# Patient Record
Sex: Male | Born: 1935 | Race: White | Hispanic: No | Marital: Married | Smoking: Never smoker
Health system: Southern US, Community
[De-identification: ages and names within clinical notes are randomized; demographics above are authoritative.]

## PROBLEM LIST (undated history)

## (undated) DIAGNOSIS — Z950 Presence of cardiac pacemaker: Secondary | ICD-10-CM

## (undated) DIAGNOSIS — I2510 Atherosclerotic heart disease of native coronary artery without angina pectoris: Secondary | ICD-10-CM

## (undated) DIAGNOSIS — R001 Bradycardia, unspecified: Secondary | ICD-10-CM

## (undated) DIAGNOSIS — I10 Essential (primary) hypertension: Secondary | ICD-10-CM

## (undated) DIAGNOSIS — N189 Chronic kidney disease, unspecified: Secondary | ICD-10-CM

## (undated) DIAGNOSIS — E119 Type 2 diabetes mellitus without complications: Secondary | ICD-10-CM

## (undated) HISTORY — PX: PACEMAKER INSERTION: SHX728

## (undated) HISTORY — PX: REPLACEMENT TOTAL KNEE BILATERAL: SUR1225

## (undated) HISTORY — PX: SHOULDER SURGERY: SHX246

---

## 2016-07-26 ENCOUNTER — Observation Stay (HOSPITAL_BASED_OUTPATIENT_CLINIC_OR_DEPARTMENT_OTHER)
Admit: 2016-07-26 | Discharge: 2016-07-26 | Disposition: A | Discharge status: Home / Self Care | Payer: Medicare HMO | Attending: Internal Medicine | Admitting: Internal Medicine

## 2016-07-26 ENCOUNTER — Encounter: Payer: Self-pay | Admitting: Emergency Medicine

## 2016-07-26 ENCOUNTER — Emergency Department: Payer: Medicare HMO

## 2016-07-26 ENCOUNTER — Observation Stay
Admission: EM | Admit: 2016-07-26 | Discharge: 2016-07-28 | Disposition: A | Discharge status: Home / Self Care | Payer: Medicare HMO | Attending: Internal Medicine | Admitting: Internal Medicine

## 2016-07-26 DIAGNOSIS — D649 Anemia, unspecified: Secondary | ICD-10-CM | POA: Insufficient documentation

## 2016-07-26 DIAGNOSIS — N183 Chronic kidney disease, stage 3 (moderate): Secondary | ICD-10-CM | POA: Diagnosis not present

## 2016-07-26 DIAGNOSIS — I2510 Atherosclerotic heart disease of native coronary artery without angina pectoris: Secondary | ICD-10-CM | POA: Insufficient documentation

## 2016-07-26 DIAGNOSIS — M1711 Unilateral primary osteoarthritis, right knee: Secondary | ICD-10-CM | POA: Insufficient documentation

## 2016-07-26 DIAGNOSIS — R7989 Other specified abnormal findings of blood chemistry: Secondary | ICD-10-CM | POA: Insufficient documentation

## 2016-07-26 DIAGNOSIS — Z96653 Presence of artificial knee joint, bilateral: Secondary | ICD-10-CM | POA: Insufficient documentation

## 2016-07-26 DIAGNOSIS — E1122 Type 2 diabetes mellitus with diabetic chronic kidney disease: Secondary | ICD-10-CM | POA: Insufficient documentation

## 2016-07-26 DIAGNOSIS — R001 Bradycardia, unspecified: Secondary | ICD-10-CM | POA: Diagnosis not present

## 2016-07-26 DIAGNOSIS — R778 Other specified abnormalities of plasma proteins: Secondary | ICD-10-CM

## 2016-07-26 DIAGNOSIS — G4733 Obstructive sleep apnea (adult) (pediatric): Secondary | ICD-10-CM | POA: Insufficient documentation

## 2016-07-26 DIAGNOSIS — R55 Syncope and collapse: Principal | ICD-10-CM | POA: Insufficient documentation

## 2016-07-26 DIAGNOSIS — Z6834 Body mass index (BMI) 34.0-34.9, adult: Secondary | ICD-10-CM | POA: Insufficient documentation

## 2016-07-26 DIAGNOSIS — Z7901 Long term (current) use of anticoagulants: Secondary | ICD-10-CM | POA: Insufficient documentation

## 2016-07-26 DIAGNOSIS — F329 Major depressive disorder, single episode, unspecified: Secondary | ICD-10-CM | POA: Diagnosis not present

## 2016-07-26 DIAGNOSIS — M25461 Effusion, right knee: Secondary | ICD-10-CM | POA: Insufficient documentation

## 2016-07-26 DIAGNOSIS — R262 Difficulty in walking, not elsewhere classified: Secondary | ICD-10-CM

## 2016-07-26 DIAGNOSIS — M25511 Pain in right shoulder: Secondary | ICD-10-CM | POA: Insufficient documentation

## 2016-07-26 DIAGNOSIS — I340 Nonrheumatic mitral (valve) insufficiency: Secondary | ICD-10-CM | POA: Insufficient documentation

## 2016-07-26 DIAGNOSIS — N179 Acute kidney failure, unspecified: Secondary | ICD-10-CM | POA: Diagnosis not present

## 2016-07-26 DIAGNOSIS — I6523 Occlusion and stenosis of bilateral carotid arteries: Secondary | ICD-10-CM | POA: Insufficient documentation

## 2016-07-26 DIAGNOSIS — H538 Other visual disturbances: Secondary | ICD-10-CM | POA: Insufficient documentation

## 2016-07-26 DIAGNOSIS — I700 Atherosclerosis of aorta: Secondary | ICD-10-CM | POA: Insufficient documentation

## 2016-07-26 DIAGNOSIS — M6281 Muscle weakness (generalized): Secondary | ICD-10-CM

## 2016-07-26 DIAGNOSIS — Z8546 Personal history of malignant neoplasm of prostate: Secondary | ICD-10-CM | POA: Insufficient documentation

## 2016-07-26 DIAGNOSIS — E039 Hypothyroidism, unspecified: Secondary | ICD-10-CM | POA: Insufficient documentation

## 2016-07-26 DIAGNOSIS — M549 Dorsalgia, unspecified: Secondary | ICD-10-CM

## 2016-07-26 DIAGNOSIS — E860 Dehydration: Secondary | ICD-10-CM | POA: Insufficient documentation

## 2016-07-26 DIAGNOSIS — Z823 Family history of stroke: Secondary | ICD-10-CM | POA: Insufficient documentation

## 2016-07-26 DIAGNOSIS — I129 Hypertensive chronic kidney disease with stage 1 through stage 4 chronic kidney disease, or unspecified chronic kidney disease: Secondary | ICD-10-CM | POA: Diagnosis not present

## 2016-07-26 DIAGNOSIS — I214 Non-ST elevation (NSTEMI) myocardial infarction: Secondary | ICD-10-CM

## 2016-07-26 DIAGNOSIS — I480 Paroxysmal atrial fibrillation: Secondary | ICD-10-CM | POA: Diagnosis not present

## 2016-07-26 DIAGNOSIS — K219 Gastro-esophageal reflux disease without esophagitis: Secondary | ICD-10-CM | POA: Insufficient documentation

## 2016-07-26 DIAGNOSIS — Z23 Encounter for immunization: Secondary | ICD-10-CM | POA: Insufficient documentation

## 2016-07-26 DIAGNOSIS — D696 Thrombocytopenia, unspecified: Secondary | ICD-10-CM | POA: Diagnosis not present

## 2016-07-26 DIAGNOSIS — Z794 Long term (current) use of insulin: Secondary | ICD-10-CM | POA: Insufficient documentation

## 2016-07-26 DIAGNOSIS — W19XXXA Unspecified fall, initial encounter: Secondary | ICD-10-CM

## 2016-07-26 DIAGNOSIS — E875 Hyperkalemia: Secondary | ICD-10-CM | POA: Diagnosis not present

## 2016-07-26 DIAGNOSIS — N189 Chronic kidney disease, unspecified: Secondary | ICD-10-CM

## 2016-07-26 DIAGNOSIS — M109 Gout, unspecified: Secondary | ICD-10-CM | POA: Insufficient documentation

## 2016-07-26 DIAGNOSIS — M25512 Pain in left shoulder: Secondary | ICD-10-CM | POA: Insufficient documentation

## 2016-07-26 DIAGNOSIS — E669 Obesity, unspecified: Secondary | ICD-10-CM | POA: Insufficient documentation

## 2016-07-26 DIAGNOSIS — Z950 Presence of cardiac pacemaker: Secondary | ICD-10-CM | POA: Insufficient documentation

## 2016-07-26 HISTORY — DX: Essential (primary) hypertension: I10

## 2016-07-26 HISTORY — DX: Bradycardia, unspecified: R00.1

## 2016-07-26 HISTORY — DX: Chronic kidney disease, unspecified: N18.9

## 2016-07-26 HISTORY — DX: Type 2 diabetes mellitus without complications: E11.9

## 2016-07-26 HISTORY — DX: Atherosclerotic heart disease of native coronary artery without angina pectoris: I25.10

## 2016-07-26 HISTORY — DX: Presence of cardiac pacemaker: Z95.0

## 2016-07-26 LAB — CBC WITH DIFFERENTIAL/PLATELET
BASOS ABS: 0 10*3/uL (ref 0–0.1)
BASOS PCT: 1 %
Eosinophils Absolute: 0.1 10*3/uL (ref 0–0.7)
Eosinophils Relative: 1 %
HEMATOCRIT: 29.8 % — AB (ref 40.0–52.0)
HEMOGLOBIN: 10.1 g/dL — AB (ref 13.0–18.0)
LYMPHS PCT: 11 %
Lymphs Abs: 0.9 10*3/uL — ABNORMAL LOW (ref 1.0–3.6)
MCH: 32.5 pg (ref 26.0–34.0)
MCHC: 33.9 g/dL (ref 32.0–36.0)
MCV: 95.6 fL (ref 80.0–100.0)
Monocytes Absolute: 0.4 10*3/uL (ref 0.2–1.0)
Monocytes Relative: 5 %
NEUTROS ABS: 6.6 10*3/uL — AB (ref 1.4–6.5)
NEUTROS PCT: 82 %
Platelets: 134 10*3/uL — ABNORMAL LOW (ref 150–440)
RBC: 3.12 MIL/uL — AB (ref 4.40–5.90)
RDW: 15.5 % — AB (ref 11.5–14.5)
WBC: 8.1 10*3/uL (ref 3.8–10.6)

## 2016-07-26 LAB — COMPREHENSIVE METABOLIC PANEL
ALBUMIN: 3.8 g/dL (ref 3.5–5.0)
ALK PHOS: 41 U/L (ref 38–126)
ALT: 9 U/L — ABNORMAL LOW (ref 17–63)
ANION GAP: 6 (ref 5–15)
AST: 12 U/L — AB (ref 15–41)
BILIRUBIN TOTAL: 0.6 mg/dL (ref 0.3–1.2)
BUN: 44 mg/dL — AB (ref 6–20)
CALCIUM: 8.4 mg/dL — AB (ref 8.9–10.3)
CO2: 23 mmol/L (ref 22–32)
Chloride: 110 mmol/L (ref 101–111)
Creatinine, Ser: 2.76 mg/dL — ABNORMAL HIGH (ref 0.61–1.24)
GFR calc Af Amer: 23 mL/min — ABNORMAL LOW (ref >60)
GFR calc non Af Amer: 20 mL/min — ABNORMAL LOW (ref >60)
GLUCOSE: 121 mg/dL — AB (ref 65–99)
Potassium: 5.2 mmol/L — ABNORMAL HIGH (ref 3.5–5.1)
SODIUM: 139 mmol/L (ref 135–145)
TOTAL PROTEIN: 6.8 g/dL (ref 6.5–8.1)

## 2016-07-26 LAB — TROPONIN I
TROPONIN I: 0.1 ng/mL — AB (ref <0.03)
Troponin I: 0.09 ng/mL (ref <0.03)

## 2016-07-26 LAB — GLUCOSE, CAPILLARY
GLUCOSE-CAPILLARY: 125 mg/dL — AB (ref 65–99)
Glucose-Capillary: 130 mg/dL — ABNORMAL HIGH (ref 65–99)

## 2016-07-26 MED ORDER — GABAPENTIN 300 MG PO CAPS
300.0000 mg | ORAL_CAPSULE | Freq: Every day | ORAL | Status: DC
  Administered 2016-07-26 – 2016-07-27 (×2): 300 mg via ORAL
  Filled 2016-07-26 (×2): qty 1

## 2016-07-26 MED ORDER — INSULIN ASPART 100 UNIT/ML ~~LOC~~ SOLN: 0.0000 [IU] | Freq: Every day | SUBCUTANEOUS | Status: DC

## 2016-07-26 MED ORDER — INSULIN ASPART 100 UNIT/ML ~~LOC~~ SOLN
0.0000 [IU] | Freq: Three times a day (TID) | SUBCUTANEOUS | Status: DC
  Administered 2016-07-28: 1 [IU] via SUBCUTANEOUS
  Filled 2016-07-26 (×2): qty 1

## 2016-07-26 MED ORDER — LEVOTHYROXINE SODIUM 50 MCG PO TABS
25.0000 ug | ORAL_TABLET | Freq: Every day | ORAL | Status: DC
  Administered 2016-07-27 – 2016-07-28 (×2): 25 ug via ORAL
  Filled 2016-07-26 (×2): qty 1

## 2016-07-26 MED ORDER — ALLOPURINOL 100 MG PO TABS
100.0000 mg | ORAL_TABLET | Freq: Every day | ORAL | Status: DC
  Administered 2016-07-26 – 2016-07-28 (×3): 100 mg via ORAL
  Filled 2016-07-26 (×3): qty 1

## 2016-07-26 MED ORDER — ACETAMINOPHEN 650 MG RE SUPP: 650.0000 mg | Freq: Four times a day (QID) | RECTAL | Status: DC | PRN

## 2016-07-26 MED ORDER — ENOXAPARIN SODIUM 30 MG/0.3ML ~~LOC~~ SOLN
30.0000 mg | SUBCUTANEOUS | Status: DC
  Administered 2016-07-26: 30 mg via SUBCUTANEOUS
  Filled 2016-07-26: qty 0.3

## 2016-07-26 MED ORDER — PNEUMOCOCCAL VAC POLYVALENT 25 MCG/0.5ML IJ INJ
0.5000 mL | INJECTION | INTRAMUSCULAR | Status: DC
  Filled 2016-07-26: qty 0.5

## 2016-07-26 MED ORDER — GLIPIZIDE 10 MG PO TABS
10.0000 mg | ORAL_TABLET | Freq: Every day | ORAL | Status: DC
  Administered 2016-07-27 – 2016-07-28 (×2): 10 mg via ORAL
  Filled 2016-07-26 (×2): qty 1

## 2016-07-26 MED ORDER — SODIUM CHLORIDE 0.9 % IV BOLUS (SEPSIS)
500.0000 mL | Freq: Once | INTRAVENOUS | Status: AC
  Administered 2016-07-26: 500 mL via INTRAVENOUS

## 2016-07-26 MED ORDER — ACETAMINOPHEN 325 MG PO TABS
650.0000 mg | ORAL_TABLET | Freq: Four times a day (QID) | ORAL | Status: DC | PRN
  Administered 2016-07-27: 650 mg via ORAL
  Filled 2016-07-26: qty 2

## 2016-07-26 MED ORDER — ASPIRIN 81 MG PO CHEW
324.0000 mg | CHEWABLE_TABLET | Freq: Once | ORAL | Status: AC
  Administered 2016-07-26: 324 mg via ORAL
  Filled 2016-07-26: qty 4

## 2016-07-26 MED ORDER — PANTOPRAZOLE SODIUM 40 MG PO TBEC
40.0000 mg | DELAYED_RELEASE_TABLET | Freq: Every day | ORAL | Status: DC
  Administered 2016-07-27 – 2016-07-28 (×2): 40 mg via ORAL
  Filled 2016-07-26 (×2): qty 1

## 2016-07-26 MED ORDER — CITALOPRAM HYDROBROMIDE 20 MG PO TABS
40.0000 mg | ORAL_TABLET | Freq: Every day | ORAL | Status: DC
  Administered 2016-07-27 – 2016-07-28 (×2): 40 mg via ORAL
  Filled 2016-07-26 (×2): qty 2

## 2016-07-26 MED ORDER — SODIUM CHLORIDE 0.9 % IV SOLN
INTRAVENOUS | Status: DC
  Administered 2016-07-26 – 2016-07-28 (×3): via INTRAVENOUS

## 2016-07-26 MED ORDER — INFLUENZA VAC SPLIT QUAD 0.5 ML IM SUSY
0.5000 mL | PREFILLED_SYRINGE | INTRAMUSCULAR | Status: AC
  Administered 2016-07-27: 0.5 mL via INTRAMUSCULAR
  Filled 2016-07-26: qty 0.5

## 2016-07-26 MED ORDER — FERROUS SULFATE 325 (65 FE) MG PO TABS
325.0000 mg | ORAL_TABLET | Freq: Every day | ORAL | Status: DC
  Administered 2016-07-27 – 2016-07-28 (×2): 325 mg via ORAL
  Filled 2016-07-26 (×2): qty 1

## 2016-07-26 NOTE — ED Triage Notes (Signed)
Pt reports he was driving and started having bilateral shoulder blade pain; reports he felt like he was going to pass out. Pt arrived via EMS alert and oriented, denies any pain at present.

## 2016-07-26 NOTE — ED Provider Notes (Addendum)
[facility] Emergency Department Provider Note  ____________________________________________  Time seen: Approximately 3:30 PM  I have reviewed the triage vital signs and the nursing notes.   HISTORY  Chief Complaint Dizziness   HPI Patrick Robinson is a 82 y.o. male with a history of sick sinus syndrome status post pacemaker, CAD s/p PCI and DES 2003, diabetes, prostate cancer now in remission, OSA, CKD, hypertension who presents for evaluation of syncope. Patient reports that he was driving home with his wife next to him when he developed sudden onset of severe pain located between his shoulder blades. He reports that the pain was: Nonradiating. He felt dizzy, nauseous, short of breath. He was able to pull over and when he got out of the car patient had a syncopal episode. Bystanders or neck that him and were able to hold patient and he did not sustain any injuries. He denies ever having anything similar. Patient at this time denies any pain. He reports that he still feels a little dizzy but the shortness of breath and nausea have resolved. Patient denies any personal or family history of blood clots, recent travel or immobilization, leg pain or swelling, hemoptysis.  Past Medical History:  Diagnosis Date  . Chronic kidney disease   . Coronary artery disease   . Diabetes mellitus without complication (HCC)   . Hypertension   . Presence of permanent cardiac pacemaker     Patient Active Problem List   Diagnosis Date Noted  . Syncope 07/26/2016    Past Surgical History:  Procedure Laterality Date  . PACEMAKER INSERTION    . REPLACEMENT TOTAL KNEE BILATERAL    . SHOULDER SURGERY Bilateral     Prior to Admission medications   Medication Sig Start Date End Date Taking? Authorizing Provider  allopurinol (ZYLOPRIM) 100 MG tablet Take 100 mg by mouth daily.   Yes Historical Provider, MD  citalopram (CELEXA) 40 MG tablet Take 40 mg by mouth daily.   Yes  Historical Provider, MD  ferrous sulfate 325 (65 FE) MG tablet Take 325 mg by mouth daily with breakfast.   Yes Historical Provider, MD  gabapentin (NEURONTIN) 300 MG capsule Take 300 mg by mouth at bedtime.   Yes Historical Provider, MD  glipiZIDE (GLUCOTROL) 10 MG tablet Take 10 mg by mouth daily before breakfast.   Yes Historical Provider, MD  hydrochlorothiazide (HYDRODIURIL) 25 MG tablet Take 25 mg by mouth daily.   Yes Historical Provider, MD  levothyroxine (SYNTHROID, LEVOTHROID) 25 MCG tablet Take 25 mcg by mouth daily before breakfast.   Yes Historical Provider, MD  lisinopril (PRINIVIL,ZESTRIL) 20 MG tablet Take 20 mg by mouth daily.   Yes Historical Provider, MD  losartan (COZAAR) 25 MG tablet Take 25 mg by mouth daily.   Yes Historical Provider, MD  omeprazole (PRILOSEC) 20 MG capsule Take 20 mg by mouth daily.   Yes Historical Provider, MD  oxybutynin (DITROPAN) 5 MG tablet Take 5 mg by mouth daily.   Yes Historical Provider, MD    Allergies Review of patient's allergies indicates no known allergies.  Family History  Problem Relation Age of Onset  . Alzheimer's disease Mother   . CVA Father     Social History Social History  Substance Use Topics  . Smoking status: Never Smoker  . Smokeless tobacco: Never Used  . Alcohol use No    Review of Systems  Constitutional: Negative for fever. + syncope Eyes: Negative for visual changes. ENT: Negative for sore throat.  Cardiovascular: Negative for chest pain. Respiratory: + shortness of breath. Gastrointestinal: Negative for abdominal pain, vomiting or diarrhea. + nausea Genitourinary: Negative for dysuria. Musculoskeletal: + upper back pain. Skin: Negative for rash. Neurological: Negative for headaches, weakness or numbness.  ____________________________________________   PHYSICAL EXAM:  VITAL SIGNS: ED Triage Vitals  Enc Vitals Group     BP 07/26/16 1516 100/64     Pulse Rate 07/26/16 1516 65     Resp 07/26/16  1516 16     Temp 07/26/16 1516 97.4 F (36.3 C)     Temp Source 07/26/16 1516 Oral     SpO2 07/26/16 1516 98 %     Weight 07/26/16 1517 251 lb (113.9 kg)     Height 07/26/16 1517 6' (1.829 m)     Head Circumference --      Peak Flow --      Pain Score 07/26/16 1517 0     Pain Loc --      Pain Edu? --      Excl. in GC? --     Constitutional: Alert and oriented. Well appearing and in no apparent distress. HEENT:      Head: Normocephalic and atraumatic.         Eyes: Conjunctivae are normal. Sclera is non-icteric. EOMI. PERRL      Mouth/Throat: Mucous membranes are moist.       Neck: Supple with no signs of meningismus. Cardiovascular: Regular rate and rhythm. No murmurs, gallops, or rubs. 2+ symmetrical distal pulses are present in all extremities. No JVD. sBP in the 80s on the R and 90s on the LUE Respiratory: Normal respiratory effort. Lungs are clear to auscultation bilaterally. No wheezes, crackles, or rhonchi.  Gastrointestinal: Soft, non tender, and non distended with positive bowel sounds. No rebound or guarding. Musculoskeletal: Nontender with normal range of motion in all extremities. No edema, cyanosis, or erythema of extremities. Neurologic: Normal speech and language. Face is symmetric. Moving all extremities. No gross focal neurologic deficits are appreciated. Skin: Skin is warm, dry and intact. No rash noted. Psychiatric: Mood and affect are normal. Speech and behavior are normal.  ____________________________________________   LABS (all labs ordered are listed, but only abnormal results are displayed)  Labs Reviewed  CBC WITH DIFFERENTIAL/PLATELET - Abnormal; Notable for the following:       Result Value   RBC 3.12 (*)    Hemoglobin 10.1 (*)    HCT 29.8 (*)    RDW 15.5 (*)    Platelets 134 (*)    Neutro Abs 6.6 (*)    Lymphs Abs 0.9 (*)    All other components within normal limits  TROPONIN I - Abnormal; Notable for the following:    Troponin I 0.09 (*)     All other components within normal limits  COMPREHENSIVE METABOLIC PANEL - Abnormal; Notable for the following:    Potassium 5.2 (*)    Glucose, Bld 121 (*)    BUN 44 (*)    Creatinine, Ser 2.76 (*)    Calcium 8.4 (*)    AST 12 (*)    ALT 9 (*)    GFR calc non Af Amer 20 (*)    GFR calc Af Amer 23 (*)    All other components within normal limits  GLUCOSE, CAPILLARY - Abnormal; Notable for the following:    Glucose-Capillary 130 (*)    All other components within normal limits  BASIC METABOLIC PANEL  CBC  CBC  CREATININE, SERUM  LIPID  PANEL  TROPONIN I  TROPONIN I  CBG MONITORING, ED   ____________________________________________  EKG  ED ECG REPORT I, Nita Sicklearolina Tienna Bienkowski, the attending physician, personally viewed and interpreted this ECG.  Paced rhythm, rate of 127, prolonged QTC, left bundle branch block, no ST elevations or depressions. No prior for comparison. ____________________________________________  RADIOLOGY  CXR:  Slight left base scarring. No edema or consolidation. Heart borderline enlarged. Pacemaker leads attached to right atrium and right ventricle. There is aortic atherosclerosis. ____________________________________________   PROCEDURES  Procedure(s) performed: None Procedures Critical Care performed:  Yes  CRITICAL CARE Performed by: Nita Sicklearolina Adana Marik  ?  Total critical care time: 35 min  Critical care time was exclusive of separately billable procedures and treating other patients.  Critical care was necessary to treat or prevent imminent or life-threatening deterioration.  Critical care was time spent personally by me on the following activities: development of treatment plan with patient and/or surrogate as well as nursing, discussions with consultants, evaluation of patient's response to treatment, examination of patient, obtaining history from patient or surrogate, ordering and performing treatments and interventions, ordering and  review of laboratory studies, ordering and review of radiographic studies, pulse oximetry and re-evaluation of patient's condition.  ____________________________________________   INITIAL IMPRESSION / ASSESSMENT AND PLAN / ED COURSE  82 y.o. male with a history of sick sinus syndrome status post pacemaker, CAD s/p PCI and DES 2003, diabetes, prostate cancer now in remission, OSA, CKD, hypertension who presents for evaluation of one episode of syncope in the setting of sudden severe upper back pain associated with shortness of breath and nausea. First EKG showing paced rhythm with no ST elevations. No prior for comparison. Patient currently chest pain-free. His heart rate is in the 60s during my evaluation with systolics in the 80s in the RUE and 90s in the LUE. Neuro intact. Ddx ACS vs dissection. Plan for IVF and pCXR and will send patient to CT. Labs including troponin and admission.  Clinical Course  Comment By Time  I also spoke with [employer] who will send a representative in the morning to interrogate patient's pacemaker.  Nita Sicklearolina Mone Commisso, MD 10/18 1802   _________________________ 5:00 PM on 07/26/2016 ----------------------------------------- Troponin 0.09. AKI with creatinine 2.76 (baseline 2.0) therefore unable to obtain CTA dissection. Since patient is pain free and with symmetric BP in b/l arms and no mediastinum widening on CXR will hold off CT. Recommended patient obtain ECHO in house. [facility]. BP improved with 500cc bolus.   Pertinent labs & imaging results that were available during my care of the patient were reviewed by me and considered in my medical decision making (see chart for details).    ____________________________________________   FINAL CLINICAL IMPRESSION(S) / ED DIAGNOSES  Final diagnoses:  NSTEMI (non-ST elevated myocardial infarction) (HCC)  Syncope, unspecified syncope type      NEW MEDICATIONS STARTED DURING THIS VISIT:  New  Prescriptions   No medications on file     Note:  This document was prepared using Dragon voice recognition software and may include unintentional dictation errors.     Nita Sicklearolina Lucelia Lacey, MD 07/26/16 1746    Nita Sicklearolina Gurneet Matarese, MD 07/26/16 [phone]

## 2016-07-26 NOTE — Progress Notes (Signed)
Anticoagulation monitoring(Lovenox):  82 yo  male ordered Lovenox 40 mg Q24h  Filed Weights   07/26/16 1517  Weight: 251 lb (113.9 kg)   BMI    Lab Results  Component Value Date   CREATININE 2.76 (H) 07/26/2016   Estimated Creatinine Clearance: 27.8 mL/min (by C-G formula based on SCr of 2.76 mg/dL (H)). Hemoglobin & Hematocrit     Component Value Date/Time   HGB 10.1 (L) 07/26/2016 1523   HCT 29.8 (L) 07/26/2016 1523     Per Protocol for Patient with estCrcl < 30 ml/min and BMI < 40, will transition to Lovenox 30 mg Q24h.

## 2016-07-26 NOTE — H&P (Signed)
[facility] -  at [facility]   PATIENT NAME: Patrick Robinson    MR#:  [id number]  DATE OF BIRTH:  [birth year]  DATE OF ADMISSION:  07/26/2016  PRIMARY CARE PHYSICIAN: Ventura Sellers   REQUESTING/REFERRING PHYSICIAN: Dr Cecil Cobbs  CHIEF COMPLAINT:   Chief Complaint  Patient presents with  . Dizziness    HISTORY OF PRESENT ILLNESS:  Patrick Robinson  is a 82 y.o. male was driving today when he developed some hurting in his shoulder blades really bad. He couldn't see very well so he pulled over his car. He told his wife that she is going to have to drive. He got out of the car and then passed out for a few seconds and then they called EMS. Patient currently feels fine and has no pain in his back. His vision is better. In the ER, his blood pressure was very low and he was given a fluid bolus. He currently feels okay. No complaints of chest pain. He does have some shortness of breath. He does have some fatigue. Patient did state that he did not urinate in the middle night last night and did not urinate until 1 PM today.  PAST MEDICAL HISTORY:   Past Medical History:  Diagnosis Date  . Chronic kidney disease   . Coronary artery disease   . Diabetes mellitus without complication (HCC)   . Hypertension   . Presence of permanent cardiac pacemaker     PAST SURGICAL HISTORY:   Past Surgical History:  Procedure Laterality Date  . PACEMAKER INSERTION    . REPLACEMENT TOTAL KNEE BILATERAL    . SHOULDER SURGERY Bilateral     SOCIAL HISTORY:   Social History  Substance Use Topics  . Smoking status: Never Smoker  . Smokeless tobacco: Never Used  . Alcohol use No    FAMILY HISTORY:   Family History  Problem Relation Age of Onset  . Alzheimer's disease Mother   . CVA Father     DRUG ALLERGIES:  No Known Allergies  REVIEW OF SYSTEMS:  CONSTITUTIONAL: No fever, Positive for fatigue. Positive for weight loss EYES: No blurred or double vision.  Couldn't see during the episode. He does wear glasses. Occasionally has blurred vision. EARS, NOSE, AND THROAT: No tinnitus or ear pain. No sore throat. Decreased hearing RESPIRATORY: Positive for cough, some shortness of breath. No wheezing or hemoptysis.  CARDIOVASCULAR: No chest pain, orthopnea, edema.  GASTROINTESTINAL: No nausea, vomiting, or abdominal pain. No blood in bowel movements. Had more frequent bowel movements yesterday GENITOURINARY: No dysuria, hematuria.  ENDOCRINE: No polyuria, nocturia,  HEMATOLOGY: No anemia, easy bruising or bleeding SKIN: No rash or lesion. MUSCULOSKELETAL: No joint pain or arthritis.   NEUROLOGIC: No tingling, numbness, weakness.  PSYCHIATRY: No anxiety or depression.   MEDICATIONS AT HOME:   Prior to Admission medications   Medication Sig Start Date End Date Taking? Authorizing Provider  allopurinol (ZYLOPRIM) 100 MG tablet Take 100 mg by mouth daily.   Yes Historical Provider, MD  citalopram (CELEXA) 40 MG tablet Take 40 mg by mouth daily.   Yes Historical Provider, MD  ferrous sulfate 325 (65 FE) MG tablet Take 325 mg by mouth daily with breakfast.   Yes Historical Provider, MD  gabapentin (NEURONTIN) 300 MG capsule Take 300 mg by mouth at bedtime.   Yes Historical Provider, MD  glipiZIDE (GLUCOTROL) 10 MG tablet Take 10 mg by mouth daily before breakfast.   Yes Historical Provider, MD  hydrochlorothiazide (HYDRODIURIL) 25  MG tablet Take 25 mg by mouth daily.   Yes Historical Provider, MD  levothyroxine (SYNTHROID, LEVOTHROID) 25 MCG tablet Take 25 mcg by mouth daily before breakfast.   Yes Historical Provider, MD  lisinopril (PRINIVIL,ZESTRIL) 20 MG tablet Take 20 mg by mouth daily.   Yes Historical Provider, MD  losartan (COZAAR) 25 MG tablet Take 25 mg by mouth daily.   Yes Historical Provider, MD  omeprazole (PRILOSEC) 20 MG capsule Take 20 mg by mouth daily.   Yes Historical Provider, MD  oxybutynin (DITROPAN) 5 MG tablet Take 5 mg by mouth  daily.   Yes Historical Provider, MD      VITAL SIGNS:  Blood pressure (!) 143/59, pulse 61, temperature 98 F (36.7 C), temperature source Oral, resp. rate 18, height 6' (1.829 m), weight 113.9 kg (251 lb), SpO2 100 %.  PHYSICAL EXAMINATION:  GENERAL:  82 y.o.-year-old patient lying in the bed with no acute distress.  EYES: Pupils equal, round, reactive to light and accommodation. No scleral icterus. Extraocular muscles intact.  HEENT: Head atraumatic, normocephalic. Oropharynx and nasopharynx clear.  NECK:  Supple, no jugular venous distention. No thyroid enlargement, no tenderness.  LUNGS: Normal breath sounds bilaterally, no wheezing, rales,rhonchi or crepitation. No use of accessory muscles of respiration.  CARDIOVASCULAR: S1, S2 normal. No murmurs, rubs, or gallops.  ABDOMEN: Soft, nontender, nondistended. Bowel sounds present. No organomegaly or mass.  EXTREMITIES: Trace edema, no cyanosis, or clubbing.  NEUROLOGIC: Cranial nerves II through XII are intact. Muscle strength 5/5 in all extremities. Sensation intact. Gait not checked.  PSYCHIATRIC: The patient is alert and oriented x 3.  SKIN: No rash, lesion, or ulcer.   LABORATORY PANEL:   CBC  Recent Labs Lab 07/26/16 1523  WBC 8.1  HGB 10.1*  HCT 29.8*  PLT 134*   ------------------------------------------------------------------------------------------------------------------  Chemistries   Recent Labs Lab 07/26/16 1523  NA 139  K 5.2*  CL 110  CO2 23  GLUCOSE 121*  BUN 44*  CREATININE 2.76*  CALCIUM 8.4*  AST 12*  ALT 9*  ALKPHOS 41  BILITOT 0.6   ------------------------------------------------------------------------------------------------------------------  Cardiac Enzymes  Recent Labs Lab 07/26/16 1523  TROPONINI 0.09*   ------------------------------------------------------------------------------------------------------------------  RADIOLOGY:  Dg Chest Portable 1 View  Result Date:  07/26/2016 CLINICAL DATA:  Chest pain radiating into shoulders.  Hypertension. EXAM: PORTABLE CHEST 1 VIEW COMPARISON:  None. FINDINGS: There is no edema or consolidation. There is slight scarring in the left base. Heart is borderline enlarged with pulmonary vascularity normal. Pacemaker leads are attached to the right atrium and right ventricle. No adenopathy. There is atherosclerotic calcification in the aorta. There is evidence of total shoulder replacements bilaterally. IMPRESSION: Slight left base scarring. No edema or consolidation. Heart borderline enlarged. Pacemaker leads attached to right atrium and right ventricle. There is aortic atherosclerosis. Electronically Signed   By: Bretta Bang III M.D.   On: 07/26/2016 16:16    EKG:   Paced.  IMPRESSION AND PLAN:   1. Syncope. Likely secondary to hypotension. Give IV fluid hydration overnight hold antihypertensive medications. ER physician called to interrogate pacer. Obtain echocardiogram. 2. Acute kidney injury on chronic kidney disease stage III. Give IV fluid hydration and check creatinine in the morning. 3. Hyperkalemia should respond to IV fluid hydration 4. Elevated troponin with history of coronary artery disease. This could be false positive with acute kidney injury. Serial troponins and cardiology consultation. Monitor on telemetry 5. History of gout on renally dosed allopurinol 6. Type 2 diabetes mellitus  on glipizide 7. Depression on Celexa 8. Hypothyroidism unspecified on levothyroxine 9. GERD on PPI  All the records are reviewed and case discussed with ED provider. Management plans discussed with the patient, family and they are in agreement.  CODE STATUS: Full code  TOTAL TIME TAKING CARE OF THIS PATIENT: 50 minutes.    Alford HighlandWIETING, Risha Barretta M.D on 07/26/2016 at 7:08 PM  Between 7am to 6pm - Pager - [phone]  After 6pm call admission pager [phone]  [facility] Office  [phone]  CC: Primary care  physician; Ventura SellersPatrick Godwin

## 2016-07-27 ENCOUNTER — Observation Stay: Payer: Medicare HMO

## 2016-07-27 ENCOUNTER — Encounter: Payer: Self-pay | Admitting: Physician Assistant

## 2016-07-27 DIAGNOSIS — R778 Other specified abnormalities of plasma proteins: Secondary | ICD-10-CM

## 2016-07-27 DIAGNOSIS — I2510 Atherosclerotic heart disease of native coronary artery without angina pectoris: Secondary | ICD-10-CM | POA: Diagnosis not present

## 2016-07-27 DIAGNOSIS — M549 Dorsalgia, unspecified: Secondary | ICD-10-CM

## 2016-07-27 DIAGNOSIS — R7989 Other specified abnormal findings of blood chemistry: Secondary | ICD-10-CM

## 2016-07-27 DIAGNOSIS — N189 Chronic kidney disease, unspecified: Secondary | ICD-10-CM

## 2016-07-27 DIAGNOSIS — D696 Thrombocytopenia, unspecified: Secondary | ICD-10-CM

## 2016-07-27 DIAGNOSIS — D649 Anemia, unspecified: Secondary | ICD-10-CM

## 2016-07-27 DIAGNOSIS — E860 Dehydration: Secondary | ICD-10-CM

## 2016-07-27 DIAGNOSIS — N179 Acute kidney failure, unspecified: Secondary | ICD-10-CM

## 2016-07-27 DIAGNOSIS — I480 Paroxysmal atrial fibrillation: Secondary | ICD-10-CM | POA: Diagnosis not present

## 2016-07-27 DIAGNOSIS — R55 Syncope and collapse: Secondary | ICD-10-CM | POA: Diagnosis not present

## 2016-07-27 LAB — CBC
HCT: 27.2 % — ABNORMAL LOW (ref 40.0–52.0)
Hemoglobin: 9.1 g/dL — ABNORMAL LOW (ref 13.0–18.0)
MCH: 31.9 pg (ref 26.0–34.0)
MCHC: 33.5 g/dL (ref 32.0–36.0)
MCV: 95.1 fL (ref 80.0–100.0)
PLATELETS: 109 10*3/uL — AB (ref 150–440)
RBC: 2.86 MIL/uL — AB (ref 4.40–5.90)
RDW: 15.8 % — ABNORMAL HIGH (ref 11.5–14.5)
WBC: 6.3 10*3/uL (ref 3.8–10.6)

## 2016-07-27 LAB — BASIC METABOLIC PANEL
Anion gap: 4 — ABNORMAL LOW (ref 5–15)
BUN: 44 mg/dL — ABNORMAL HIGH (ref 6–20)
CHLORIDE: 115 mmol/L — AB (ref 101–111)
CO2: 22 mmol/L (ref 22–32)
CREATININE: 2.23 mg/dL — AB (ref 0.61–1.24)
Calcium: 8.3 mg/dL — ABNORMAL LOW (ref 8.9–10.3)
GFR calc non Af Amer: 26 mL/min — ABNORMAL LOW (ref >60)
GFR, EST AFRICAN AMERICAN: 30 mL/min — AB (ref >60)
Glucose, Bld: 104 mg/dL — ABNORMAL HIGH (ref 65–99)
POTASSIUM: 4.8 mmol/L (ref 3.5–5.1)
SODIUM: 141 mmol/L (ref 135–145)

## 2016-07-27 LAB — ECHOCARDIOGRAM COMPLETE
HEIGHTINCHES: 72 in
Weight: 4016 oz

## 2016-07-27 LAB — LIPID PANEL
CHOL/HDL RATIO: 5.5 ratio
CHOLESTEROL: 160 mg/dL (ref 0–200)
HDL: 29 mg/dL — AB (ref >40)
LDL Cholesterol: 119 mg/dL — ABNORMAL HIGH (ref 0–99)
Triglycerides: 58 mg/dL (ref <150)
VLDL: 12 mg/dL (ref 0–40)

## 2016-07-27 LAB — TROPONIN I: TROPONIN I: 0.13 ng/mL — AB (ref <0.03)

## 2016-07-27 LAB — GLUCOSE, CAPILLARY
GLUCOSE-CAPILLARY: 105 mg/dL — AB (ref 65–99)
GLUCOSE-CAPILLARY: 77 mg/dL (ref 65–99)
GLUCOSE-CAPILLARY: 93 mg/dL (ref 65–99)
Glucose-Capillary: 96 mg/dL (ref 65–99)

## 2016-07-27 MED ORDER — APIXABAN 2.5 MG PO TABS: 2.5000 mg | ORAL_TABLET | Freq: Two times a day (BID) | ORAL | 5 refills | Status: DC

## 2016-07-27 MED ORDER — APIXABAN 2.5 MG PO TABS
2.5000 mg | ORAL_TABLET | Freq: Two times a day (BID) | ORAL | Status: DC
  Administered 2016-07-27: 2.5 mg via ORAL
  Filled 2016-07-27: qty 1

## 2016-07-27 NOTE — Care Management Obs Status (Signed)
MEDICARE OBSERVATION STATUS NOTIFICATION   Patient Details  Name: Patrick Robinson MRN: [id number] Date of Birth: April 12, 1933   Medicare Observation Status Notification Given:  Yes    Berna BueCheryl Ki Luckman, RN 07/27/2016, 10:14 AM

## 2016-07-27 NOTE — Consult Note (Signed)
Cardiology Consultation Note  Patient ID: Patrick Robinson, MRN: [id number], DOB/AGE: [birth year] 82 y.o. Admit date: 07/26/2016   Date of Consult: 07/27/2016 Primary Physician: No PCP Per Patient Primary Cardiologist: [facility] Requesting Physician: Dr. Renae Gloss, MD  Chief Complaint: Syncope Reason for Consult: Same  HPI: 82 y.o. male with h/o CAD s/p remote PCI/DES in 2003, symptomatic bradycardia s/p MDT PPM in 07/2008, PAF diagnosed in 2015 on Eliquis at home, CKD stage III, DM2, HTN, prostate cancer in remission, hypothyroidism, OSA on CPAP, and obesity who presented to [facility] on 10/18 with bilateral shoulder pain and suffering a syncopal episode just after driving.   He is followed by [facility]. He most recently underwent cardiac cath in 2006 with medical management being advised. Most recent EF from 11/2014 showed an EF of 50%, moderate LVH, trivial MR/PR/TR, DD, normal RV systolic function.  He was driving to breakfast on 40/98 when he suddenly developed bilateral shoulder pain after eating. This pain persisted after eating breakfast while he was attempting to drive to the mall with his wife. After about 30 minutes the pain became unbearable and was associated with blurry vision. He pulled over on the side of the road to have his wife drive. While walking behind the car he suffered a syncopal episode. He reports he had shoulder pain at that time, not chest pain, SOB, or palpitations. He does not remember anything after that. He was transported to [facility] where he was found to have a mildly elevated troponin with a peak of 0.13, SCr 2.76, K+ 5.2, wbc 8.1, hgb 10.1, and have slightly soft BP in the low 100's systolic. Orthostatic vital signs were not checked. He has since received IV fluids. CXR showed no acute process. EKG showed an AV paced rhythm, 127 bpm. He has no complaints this morning.     Past Medical History:  Diagnosis Date  . Chronic kidney disease   . Coronary artery disease     . Diabetes mellitus without complication (HCC)   . Hypertension   . Presence of permanent cardiac pacemaker   . Symptomatic bradycardia       Most Recent Cardiac Studies: Echo 07/26/2016: Study Conclusions  - Left ventricle: The cavity size was normal. There was moderate   concentric hypertrophy. Systolic function was normal. The   estimated ejection fraction was in the range of 55% to 60%. Wall   motion was normal; there were no regional wall motion   abnormalities. Doppler parameters are consistent with abnormal   left ventricular relaxation (grade 1 diastolic dysfunction). - Aortic valve: There was mild stenosis. Mean gradient (S): 17 mm   Hg. Valve area (VTI): 1.55 cm^2. - Mitral valve: There was mild regurgitation. - Left atrium: The atrium was mildly dilated.   Surgical History:  Past Surgical History:  Procedure Laterality Date  . PACEMAKER INSERTION    . REPLACEMENT TOTAL KNEE BILATERAL    . SHOULDER SURGERY Bilateral      Home Meds: Prior to Admission medications   Medication Sig Start Date End Date Taking? Authorizing Provider  allopurinol (ZYLOPRIM) 100 MG tablet Take 100 mg by mouth daily.   Yes Historical Provider, MD  citalopram (CELEXA) 40 MG tablet Take 40 mg by mouth daily.   Yes Historical Provider, MD  ferrous sulfate 325 (65 FE) MG tablet Take 325 mg by mouth daily with breakfast.   Yes Historical Provider, MD  gabapentin (NEURONTIN) 300 MG capsule Take 300 mg by mouth at bedtime.   Yes  Historical Provider, MD  glipiZIDE (GLUCOTROL) 10 MG tablet Take 10 mg by mouth daily before breakfast.   Yes Historical Provider, MD  hydrochlorothiazide (HYDRODIURIL) 25 MG tablet Take 25 mg by mouth daily.   Yes Historical Provider, MD  levothyroxine (SYNTHROID, LEVOTHROID) 25 MCG tablet Take 25 mcg by mouth daily before breakfast.   Yes Historical Provider, MD  lisinopril (PRINIVIL,ZESTRIL) 20 MG tablet Take 20 mg by mouth daily.   Yes Historical Provider, MD   losartan (COZAAR) 25 MG tablet Take 25 mg by mouth daily.   Yes Historical Provider, MD  omeprazole (PRILOSEC) 20 MG capsule Take 20 mg by mouth daily.   Yes Historical Provider, MD  oxybutynin (DITROPAN) 5 MG tablet Take 5 mg by mouth daily.   Yes Historical Provider, MD    Inpatient Medications:  . allopurinol  100 mg Oral Daily  . citalopram  40 mg Oral Daily  . enoxaparin (LOVENOX) injection  30 mg Subcutaneous Q24H  . ferrous sulfate  325 mg Oral Q breakfast  . gabapentin  300 mg Oral QHS  . glipiZIDE  10 mg Oral QAC breakfast  . Influenza vac split quadrivalent PF  0.5 mL Intramuscular Tomorrow-1000  . insulin aspart  0-5 Units Subcutaneous QHS  . insulin aspart  0-9 Units Subcutaneous TID WC  . levothyroxine  25 mcg Oral QAC breakfast  . pantoprazole  40 mg Oral Daily  . pneumococcal 23 valent vaccine  0.5 mL Intramuscular Tomorrow-1000   . sodium chloride 75 mL/hr at 07/26/16 2000    Allergies: No Known Allergies  Social History   Social History  . Marital status: Married    Spouse name: N/A  . Number of children: N/A  . Years of education: N/A   Occupational History  . Not on file.   Social History Main Topics  . Smoking status: Never Smoker  . Smokeless tobacco: Never Used  . Alcohol use No  . Drug use: No  . Sexual activity: Not on file   Other Topics Concern  . Not on file   Social History Narrative  . No narrative on file     Family History  Problem Relation Age of Onset  . Alzheimer's disease Mother   . CVA Father      Review of Systems: Review of Systems  Constitutional: Positive for malaise/fatigue. Negative for chills, diaphoresis, fever and weight loss.  HENT: Negative for congestion.   Eyes: Positive for blurred vision. Negative for discharge and redness.  Respiratory: Negative for cough, hemoptysis, sputum production, shortness of breath and wheezing.   Cardiovascular: Negative for chest pain, palpitations, orthopnea, claudication,  leg swelling and PND.  Gastrointestinal: Negative for abdominal pain, blood in stool, heartburn, melena, nausea and vomiting.  Genitourinary: Negative for hematuria.  Musculoskeletal: Negative for falls and myalgias.  Skin: Negative for rash.  Neurological: Positive for loss of consciousness and weakness. Negative for dizziness, tingling, tremors, sensory change, speech change and focal weakness.  Endo/Heme/Allergies: Does not bruise/bleed easily.  Psychiatric/Behavioral: Negative for substance abuse. The patient is not nervous/anxious.   All other systems reviewed and are negative.   Labs:  Recent Labs  07/26/16 1523 07/26/16 1858 07/26/16 2351  TROPONINI 0.09* 0.10* 0.13*   Lab Results  Component Value Date   WBC 6.3 07/27/2016   HGB 9.1 (L) 07/27/2016   HCT 27.2 (L) 07/27/2016   MCV 95.1 07/27/2016   PLT 109 (L) 07/27/2016     Recent Labs Lab 07/26/16 1523 07/27/16 0528  NA  139 141  K 5.2* 4.8  CL 110 115*  CO2 23 22  BUN 44* 44*  CREATININE 2.76* 2.23*  CALCIUM 8.4* 8.3*  PROT 6.8  --   BILITOT 0.6  --   ALKPHOS 41  --   ALT 9*  --   AST 12*  --   GLUCOSE 121* 104*   Lab Results  Component Value Date   CHOL 160 07/27/2016   HDL 29 (L) 07/27/2016   LDLCALC 119 (H) 07/27/2016   TRIG 58 07/27/2016   No results found for: DDIMER  Radiology/Studies:  Dg Chest Portable 1 View  Result Date: 07/26/2016 CLINICAL DATA:  Chest pain radiating into shoulders.  Hypertension. EXAM: PORTABLE CHEST 1 VIEW COMPARISON:  None. FINDINGS: There is no edema or consolidation. There is slight scarring in the left base. Heart is borderline enlarged with pulmonary vascularity normal. Pacemaker leads are attached to the right atrium and right ventricle. No adenopathy. There is atherosclerotic calcification in the aorta. There is evidence of total shoulder replacements bilaterally. IMPRESSION: Slight left base scarring. No edema or consolidation. Heart borderline enlarged.  Pacemaker leads attached to right atrium and right ventricle. There is aortic atherosclerosis. Electronically Signed   By: Bretta Bang III M.D.   On: 07/26/2016 16:16    EKG: Interpreted by me showed: AV paced, 123 bpm Telemetry: Interpreted by me showed: AV paced, 60 bpm  Weights: Filed Weights   07/26/16 1517  Weight: 251 lb (113.9 kg)     Physical Exam: Blood pressure (!) 114/53, pulse 61, temperature 98.1 F (36.7 C), temperature source Oral, resp. rate 18, height 6' (1.829 m), weight 251 lb (113.9 kg), SpO2 93 %. Body mass index is 34.04 kg/m. General: Well developed, well nourished, in no acute distress. Head: Normocephalic, atraumatic, sclera non-icteric, no xanthomas, nares are without discharge.  Neck: Negative for carotid bruits. JVD not elevated. Lungs: Clear bilaterally to auscultation without wheezes, rales, or rhonchi. Breathing is unlabored. Heart: RRR with S1 S2. No murmurs, rubs, or gallops appreciated. Abdomen: Soft, non-tender, non-distended with normoactive bowel sounds. No hepatomegaly. No rebound/guarding. No obvious abdominal masses. Msk:  Strength and tone appear normal for age. Extremities: No clubbing or cyanosis. No edema. Distal pedal pulses are 2+ and equal bilaterally. Neuro: Alert and oriented X 3. No facial asymmetry. No focal deficit. Moves all extremities spontaneously. Psych:  Responds to questions appropriately with a normal affect.    Assessment and Plan:  Active Problems:   Syncope    1. Syncope: -Slow onset, likely neuromediated and not arrhythmia in etiology  -MDT to interrogate PPM this morning  -Recommend CTA of the chest to evaluate for etiology given his shoulder pain, defer to IM -Possibly dehydrated and orthostatic given episode occurred with positional changes  -Echo with normal EF as above -Check magnesium   2. Symptomatic bradycardia: -Pacer to be interrogated as above -Appears to be functioning well on tele  3.  PAF: -Await interrogation to assess Afib burden -Would continue at home Eliquis  -Continue home medications including Coreg as BP allows -CHADS2VASc at least 5 (HTN, age x 2, DM, vascular disease)  4. CAD/elevated troponin: -Echo as above -Will need ischemic evaluation, can be done through [facility] -Has been on dual therapy with ASA and Eliquis, defer to primary cardiology  -Likely supply demand ischemia in the setting of #1  5. Acute on CKD stage III: -IV fluids -Per IM  6. Acute on anemia of chronic disease: -Likely dilutional  -Monitor  Signed, Eula ListenRyan Dunn, PA-C [facility] Pager: [phone] 07/27/2016, 8:53 AM  Patient seen interviewed and examined and noted addended above 3 or 4 day history of intermittent and then progressive infrascapular pain distinct from his prior ischemic pain. Unrelated to exertion. In the context of this, he had 30-45 minutes of progressive weakness and lightheadedness accompanied by blurry vision. There was some diaphoresis and clamminess. He does not recall whether his wife described him as pale. He ultimately became syncopal trying to stand up out of the car. No prior syncope. Laboratories were notable for an elevated BUN/CR ratio consistent with intravascular dehydration. Troponins were borderline elevated likely not relevant. Imaging for aortic pathology was not done   PE did not demonstrate AI   Bilateral BP are ordered  The patient's device  interrogation is pending .   The patient's symptoms seem most consistent with dysautonomia and a vagal episode with this prolonged prodrome and epiphenomena. The most important question I think is the trigger for the back pain. I would worry about aortic dissection. Imaging will be a little bit of a challenge given his renal dysfunction and we will have to look into how best to do this.

## 2016-07-27 NOTE — Progress Notes (Signed)
Spoke with IM regarding need for CTA chest to evaluate his aorta for possible dissection given his symptoms. We both agree with will be difficult considering his renal function. Suggestion of TEE was brought up, though this may not be definitive or sensitive enough to evaluate for this. Recommend discussion with radiology to gain their input. Could also discuss with patient to gain what he would like done given the risks of CTA and his renal function. I have placed nursing order for bilateral upper extremity blood pressures.

## 2016-07-27 NOTE — Progress Notes (Signed)
[facility] [place] at [facility]   PATIENT NAME: Patrick Robinson    MR#:  [id number]  DATE OF BIRTH:  10-01-1933  SUBJECTIVE:  CHIEF COMPLAINT:   Chief Complaint  Patient presents with  . Dizziness  The patient is 82 year old Caucasian male with past medical history significant for history of CK D with baseline 2.0, coronary artery disease, diabetes mellitus, hypertension, permanent pacemaker, who presents to the hospital with severe pain in between of her shoulder blades. He was driving when he started having pain, he got out from the car, and passed out. On arrival to emergency room, he was noted to have normal blood pressure of 117/59, his vital signs otherwise were unremarkable. His labs revealed acute on chronic renal failure with creatinine level of 2.76. Patient was admitted to the hospital for further evaluation and treatment, he was initiated on IV fluids and his kidney function somewhat improved. He is pain resolved. He was noted to have mild elevation of troponin with max level of 0.13. Cardiology consultation was requested. Echocardiogram was performed, unremarkable. Permanent pacemaker is to be interrogated, results pending. Cardiologist wants to have CT angiogram of the chest to rule out aortic dissection, although patient's kidney function is not good enough to consider CT angiogram at this time. Blood pressure readings in both arms are pending  Review of Systems  Constitutional: Negative for chills, fever and weight loss.  HENT: Negative for congestion.   Eyes: Negative for blurred vision and double vision.  Respiratory: Negative for cough, sputum production, shortness of breath and wheezing.   Cardiovascular: Negative for chest pain, palpitations, orthopnea, leg swelling and PND.  Gastrointestinal: Negative for abdominal pain, blood in stool, constipation, diarrhea, nausea and vomiting.  Genitourinary: Negative for dysuria, frequency, hematuria and  urgency.  Musculoskeletal: Negative for falls.  Neurological: Negative for dizziness, tremors, focal weakness and headaches.  Endo/Heme/Allergies: Does not bruise/bleed easily.  Psychiatric/Behavioral: Negative for depression. The patient does not have insomnia.     VITAL SIGNS: Blood pressure (!) 118/58, pulse 60, temperature 98.1 F (36.7 C), temperature source Oral, resp. rate 17, height 6' (1.829 m), weight 113.9 kg (251 lb), SpO2 95 %.  PHYSICAL EXAMINATION:   GENERAL:  82 y.o.-year-old patient lying in the bed with no acute distress.  EYES: Pupils equal, round, reactive to light and accommodation. No scleral icterus. Extraocular muscles intact.  HEENT: Head atraumatic, normocephalic. Oropharynx and nasopharynx clear.  NECK:  Supple, no jugular venous distention. No thyroid enlargement, no tenderness.  LUNGS: Normal breath sounds bilaterally, no wheezing, rales,rhonchi or crepitation. No use of accessory muscles of respiration.  CARDIOVASCULAR: S1, S2 normal. No murmurs, rubs, or gallops.  ABDOMEN: Soft, nontender, nondistended. Bowel sounds present. No organomegaly or mass.  EXTREMITIES: No pedal edema, cyanosis, or clubbing.  NEUROLOGIC: Cranial nerves II through XII are intact. Muscle strength 5/5 in all extremities. Sensation intact. Gait not checked.  PSYCHIATRIC: The patient is alert and oriented x 3.  SKIN: No obvious rash, lesion, or ulcer.   ORDERS/RESULTS REVIEWED:   CBC  Recent Labs Lab 07/26/16 1523 07/27/16 0528  WBC 8.1 6.3  HGB 10.1* 9.1*  HCT 29.8* 27.2*  PLT 134* 109*  MCV 95.6 95.1  MCH 32.5 31.9  MCHC 33.9 33.5  RDW 15.5* 15.8*  LYMPHSABS 0.9*  --   MONOABS 0.4  --   EOSABS 0.1  --   BASOSABS 0.0  --    ------------------------------------------------------------------------------------------------------------------  Chemistries   Recent Labs Lab 07/26/16 1523 07/27/16  0528  NA 139 141  K 5.2* 4.8  CL 110 115*  CO2 23 22  GLUCOSE 121*  104*  BUN 44* 44*  CREATININE 2.76* 2.23*  CALCIUM 8.4* 8.3*  AST 12*  --   ALT 9*  --   ALKPHOS 41  --   BILITOT 0.6  --    ------------------------------------------------------------------------------------------------------------------ estimated creatinine clearance is 34.4 mL/min (by C-G formula based on SCr of 2.23 mg/dL (H)). ------------------------------------------------------------------------------------------------------------------ No results for input(s): TSH, T4TOTAL, T3FREE, THYROIDAB in the last 72 hours.  Invalid input(s): FREET3  Cardiac Enzymes  Recent Labs Lab 07/26/16 1523 07/26/16 1858 07/26/16 2351  TROPONINI 0.09* 0.10* 0.13*   ------------------------------------------------------------------------------------------------------------------ Invalid input(s): POCBNP ---------------------------------------------------------------------------------------------------------------  RADIOLOGY: [place] Carotid Bilateral  Result Date: 07/27/2016 CLINICAL DATA:  Syncope, hypertension, visual disturbance, hyperlipidemia and diabetes EXAM: BILATERAL CAROTID DUPLEX ULTRASOUND TECHNIQUE: Wallace CullensGray scale imaging, color Doppler and duplex ultrasound were performed of bilateral carotid and vertebral arteries in the neck. COMPARISON:  None. FINDINGS: Criteria: Quantification of carotid stenosis is based on velocity parameters that correlate the residual internal carotid diameter with NASCET-based stenosis levels, using the diameter of the distal internal carotid lumen as the denominator for stenosis measurement. The following velocity measurements were obtained: RIGHT ICA:  148/22 cm/sec CCA:  119/19 cm/sec SYSTOLIC ICA/CCA RATIO:  1.3 DIASTOLIC ICA/CCA RATIO:  1.2 ECA:  140 cm/sec LEFT ICA:  119/22 cm/sec CCA:  88/16 cm/sec SYSTOLIC ICA/CCA RATIO:  1.3 DIASTOLIC ICA/CCA RATIO:  1.4 ECA:  210 cm/sec RIGHT CAROTID ARTERY: Mild-to-moderate heterogeneous and partially calcified right  carotid system atherosclerosis extending into the proximal ICA. Slight velocity elevation measuring 148/22 cm/sec. Minor luminal narrowing by grayscale imaging. Degree of stenosis still estimated at less than 50%. No significant turbulent flow. RIGHT VERTEBRAL ARTERY:  Antegrade LEFT CAROTID ARTERY: Similar moderate scattered and partially calcified carotid atherosclerosis. Slight luminal narrowing. No significant ICA stenosis, velocity elevation, or turbulent flow. Degree of narrowing also less than 50% LEFT VERTEBRAL ARTERY:  Antegrade IMPRESSION: Mild-to-moderate bilateral carotid atherosclerosis without hemodynamically significant ICA stenosis. Degree of narrowing less than 50% bilaterally. Patent antegrade vertebral flow. Electronically Signed   By: Judie PetitM.  Shick M.D.   On: 07/27/2016 12:26   Dg Chest Portable 1 View  Result Date: 07/26/2016 CLINICAL DATA:  Chest pain radiating into shoulders.  Hypertension. EXAM: PORTABLE CHEST 1 VIEW COMPARISON:  None. FINDINGS: There is no edema or consolidation. There is slight scarring in the left base. Heart is borderline enlarged with pulmonary vascularity normal. Pacemaker leads are attached to the right atrium and right ventricle. No adenopathy. There is atherosclerotic calcification in the aorta. There is evidence of total shoulder replacements bilaterally. IMPRESSION: Slight left base scarring. No edema or consolidation. Heart borderline enlarged. Pacemaker leads attached to right atrium and right ventricle. There is aortic atherosclerosis. Electronically Signed   By: Bretta BangWilliam  Woodruff III M.D.   On: 07/26/2016 16:16    EKG:  Orders placed or performed during the hospital encounter of 07/26/16  . EKG 12-Lead  . EKG 12-Lead    ASSESSMENT AND PLAN:  Principal Problem:   Syncope Active Problems:   Acute on chronic renal failure (HCC)   Dehydration   Elevated troponin   Back pain   Anemia   Thrombocytopenia (HCC)  #1. Syncope, etiology is unclear,  get carotid ultrasound, echocardiogram is normal, per cardiologist. Discussed with cardiologist, who recommended to get CT angiogram of the chest to rule out aortic dissection, we'll discuss with nephrologist and patient study risks as well as  benefits #2. Acute on chronic renal failure, improving with IV fluid administration, continue IV fluids, follow creatinine in the morning #3. Elevated troponin, likely demand ischemia, echocardiogram is normal, per cardiologist, no further interventions were recommended by cardiologist #4. Acute back pain, questionable aortic dissection, CT angiogram if kidney function improves by tomorrow. Get thoracic x-ray to rule out vertebral compression fracture.  #5. Bradycardia, Pacer to be interrogated later today #6. Paroxysmal atrial fibrillation, pacer to be interrogated later today, not to continue Eliquis due to concerns of aortic dissection   Management plans discussed with the patient, family and they are in agreement.   DRUG ALLERGIES: No Known Allergies  CODE STATUS:     Code Status Orders        Start     Ordered   07/26/16 1733  Full code  Continuous     07/26/16 1732    Code Status History    Date Active Date Inactive Code Status Order ID Comments User Context   This patient has a current code status but no historical code status.      TOTAL TIME TAKING CARE OF THIS PATIENT: 45 minutes.    Katharina Caper M.D on 07/27/2016 at 1:55 PM  Between 7am to 6pm - Pager - [phone]  After 6pm go to [email or web address] - password [email or web address] [facility]  [facility]  Office  [phone]  CC: Primary care physician; No PCP Per Patient

## 2016-07-27 NOTE — Evaluation (Signed)
Physical Therapy Evaluation Patient Details Name: Patrick Robinson MRN: [id number] DOB: January 09, 1933 Today's Date: 07/27/2016   History of Present Illness  82 y.o. male was driving today when he developed some hurting in his shoulder blades really bad. He couldn't see very well so he pulled over his car. He told his wife that she is going to have to drive. He got out of the car and then passed out for a few seconds and then they called EMS. Patient currently feels fine and has no pain in his back.   Clinical Impression  Pt was able to ambulate with walker (typically he does not use one at baseline) but had consistent unsteadiness, low severity knee buckling and overall was not as safe as he seemed to think he was.  Pt was able to self-arrest most of the time to remain balance but did need min assist occasionally with stagger steps and leaning.  Pt confident that he would be safe at home and did not seem interested in HHPT, this PT did strongly recommend that he have some PT.     Follow Up Recommendations Home health PT (pt does not seem overly interested)    Equipment Recommendations       Recommendations for Other Services       Precautions / Restrictions Precautions Precautions: Fall Restrictions Weight Bearing Restrictions: No      Mobility  Bed Mobility Overal bed mobility: Modified Independent                Transfers Overall transfer level: Modified independent Equipment used: Rolling walker (2 wheeled)             General transfer comment: Pt did struggle to get to fully upright on the first attempt, ultimately able to rise w/o direct assist  Ambulation/Gait Ambulation/Gait assistance: Min guard Ambulation Distance (Feet): 100 Feet Assistive device: Rolling walker (2 wheeled)       General Gait Details: Pt initially thought he would be able to ambulate w/o AD (per baseline) but he was too unsteady and needing to hold onto objects, he did better with  walker but was still unsteady and had multiple stagger steps, knee buckling issues and generally was not near his baseline.  He reports he felt fine, but he surely did have some safety issues w/o overt LOBs.   Stairs            Wheelchair Mobility    Modified Rankin (Stroke Patients Only)       Balance Overall balance assessment: Needs assistance;Modified Independent Sitting-balance support: No upper extremity supported Sitting balance-Leahy Scale: Good Sitting balance - Comments: no issues with sitting balance   Standing balance support: Bilateral upper extremity supported Standing balance-Leahy Scale: Fair Standing balance comment: Pt with multiple small scale balance issues but was able to correct with walker                              Pertinent Vitals/Pain Pain Assessment: 0-10 Pain Score: 4  Pain Location: behind L knee    Home Living Family/patient expects to be discharged to:: Private residence Living Arrangements: Spouse/significant other Available Help at Discharge: Family   Home Access: Stairs to enter Entrance Stairs-Rails: Can reach both Entrance Stairs-Number of Steps: 2   Home Equipment: Environmental consultant - 2 wheels;Cane - single point      Prior Function Level of Independence: Independent         Comments: Pt  apparently able to do all he needs and is confident with community ambulation, etc     Hand Dominance        Extremity/Trunk Assessment   Upper Extremity Assessment: Generalized weakness (L elevation to 90, R to 120, grossly 3+/5 in available ROM)           Lower Extremity Assessment: Overall WFL for tasks assessed;Generalized weakness (pain with resisted L knee mvt,  lacks full TKE)         Communication   Communication: No difficulties  Cognition Arousal/Alertness: Awake/alert Behavior During Therapy: WFL for tasks assessed/performed Overall Cognitive Status: Within Functional Limits for tasks assessed                       General Comments      Exercises     Assessment/Plan    PT Assessment Patient needs continued PT services  PT Problem List Decreased strength;Decreased range of motion;Decreased activity tolerance;Decreased safety awareness;Decreased mobility;Decreased balance          PT Treatment Interventions DME instruction;Gait training;Stair training;Functional mobility training;Balance training;Therapeutic activities;Therapeutic exercise;Neuromuscular re-education;Patient/family education    PT Goals (Current goals can be found in the Care Plan section)  Acute Rehab PT Goals Patient Stated Goal: go home PT Goal Formulation: With patient/family Time For Goal Achievement: 08/10/16 Potential to Achieve Goals: Good    Frequency Min 2X/week   Barriers to discharge        Co-evaluation               End of Session Equipment Utilized During Treatment: Gait belt Activity Tolerance: Patient tolerated treatment well Patient left: with call bell/phone within reach;with chair alarm set;with family/visitor present      Functional Assessment Tool Used: clinical judgement Functional Limitation: Mobility: Walking and moving around Mobility: Walking and Moving Around Current Status ([id number](G8978): At least 20 percent but less than 40 percent impaired, limited or restricted Mobility: Walking and Moving Around Goal Status [phone](G8979): At least 1 percent but less than 20 percent impaired, limited or restricted    Time: 1343-1414 PT Time Calculation (min) (ACUTE ONLY): 31 min   Charges:   PT Evaluation $PT Eval Low Complexity: 1 Procedure     PT G Codes:   PT G-Codes **NOT FOR INPATIENT CLASS** Functional Assessment Tool Used: clinical judgement Functional Limitation: Mobility: Walking and moving around Mobility: Walking and Moving Around Current Status ([id number](G8978): At least 20 percent but less than 40 percent impaired, limited or restricted Mobility: Walking and Moving Around Goal  Status [phone](G8979): At least 1 percent but less than 20 percent impaired, limited or restricted    Malachi ProGalen R Shaylinn Hladik, DPT 07/27/2016, 3:39 PM

## 2016-07-27 NOTE — Progress Notes (Signed)
Troponin 0.10 and 0.13. PT is asymptomatic and sleeping comfortably. VS stable.  MD made aware. Will continue to monitor.

## 2016-07-28 ENCOUNTER — Observation Stay: Payer: Medicare HMO

## 2016-07-28 DIAGNOSIS — R748 Abnormal levels of other serum enzymes: Secondary | ICD-10-CM

## 2016-07-28 DIAGNOSIS — I480 Paroxysmal atrial fibrillation: Secondary | ICD-10-CM

## 2016-07-28 DIAGNOSIS — M546 Pain in thoracic spine: Secondary | ICD-10-CM

## 2016-07-28 DIAGNOSIS — R001 Bradycardia, unspecified: Secondary | ICD-10-CM

## 2016-07-28 DIAGNOSIS — E860 Dehydration: Secondary | ICD-10-CM

## 2016-07-28 DIAGNOSIS — D631 Anemia in chronic kidney disease: Secondary | ICD-10-CM

## 2016-07-28 DIAGNOSIS — N183 Chronic kidney disease, stage 3 (moderate): Secondary | ICD-10-CM

## 2016-07-28 DIAGNOSIS — R55 Syncope and collapse: Secondary | ICD-10-CM

## 2016-07-28 LAB — CBC
HEMATOCRIT: 28.3 % — AB (ref 40.0–52.0)
Hemoglobin: 9.3 g/dL — ABNORMAL LOW (ref 13.0–18.0)
MCH: 31.7 pg (ref 26.0–34.0)
MCHC: 32.8 g/dL (ref 32.0–36.0)
MCV: 96.5 fL (ref 80.0–100.0)
Platelets: 104 10*3/uL — ABNORMAL LOW (ref 150–440)
RBC: 2.94 MIL/uL — AB (ref 4.40–5.90)
RDW: 15.7 % — AB (ref 11.5–14.5)
WBC: 7.6 10*3/uL (ref 3.8–10.6)

## 2016-07-28 LAB — BASIC METABOLIC PANEL
Anion gap: 3 — ABNORMAL LOW (ref 5–15)
BUN: 34 mg/dL — AB (ref 6–20)
CHLORIDE: 114 mmol/L — AB (ref 101–111)
CO2: 23 mmol/L (ref 22–32)
Calcium: 8.3 mg/dL — ABNORMAL LOW (ref 8.9–10.3)
Creatinine, Ser: 1.75 mg/dL — ABNORMAL HIGH (ref 0.61–1.24)
GFR calc Af Amer: 41 mL/min — ABNORMAL LOW (ref >60)
GFR calc non Af Amer: 35 mL/min — ABNORMAL LOW (ref >60)
Glucose, Bld: 117 mg/dL — ABNORMAL HIGH (ref 65–99)
POTASSIUM: 4.8 mmol/L (ref 3.5–5.1)
SODIUM: 140 mmol/L (ref 135–145)

## 2016-07-28 LAB — GLUCOSE, CAPILLARY
Glucose-Capillary: 124 mg/dL — ABNORMAL HIGH (ref 65–99)
Glucose-Capillary: 124 mg/dL — ABNORMAL HIGH (ref 65–99)

## 2016-07-28 LAB — MAGNESIUM: MAGNESIUM: 2 mg/dL (ref 1.7–2.4)

## 2016-07-28 MED ORDER — HYDROCODONE-ACETAMINOPHEN 5-325 MG PO TABS: 1.0000 | ORAL_TABLET | ORAL | 0 refills | Status: AC | PRN

## 2016-07-28 MED ORDER — HYDROCODONE-ACETAMINOPHEN 5-325 MG PO TABS: 1.0000 | ORAL_TABLET | ORAL | Status: DC | PRN

## 2016-07-28 NOTE — Progress Notes (Signed)
Patient: Patrick Robinson / Admit Date: 07/26/2016 / Date of Encounter: 07/28/2016, 8:51 AM   Subjective: No acute overnight events. Renal function improving. No further back pain. MDT did not come by to interrogate the device as planned on 07/27/2016. No further back pain or vision changes.   Review of Systems: Review of Systems  Constitutional: Positive for malaise/fatigue. Negative for chills, diaphoresis, fever and weight loss.  HENT: Negative for congestion.   Eyes: Negative for discharge and redness.  Respiratory: Negative for cough, hemoptysis, sputum production, shortness of breath and wheezing.   Cardiovascular: Negative for chest pain, palpitations, orthopnea, claudication, leg swelling and PND.  Gastrointestinal: Negative for abdominal pain, blood in stool, heartburn, melena, nausea and vomiting.  Genitourinary: Negative for hematuria.  Musculoskeletal: Positive for back pain. Negative for falls and myalgias.  Skin: Negative for rash.  Neurological: Positive for loss of consciousness and weakness. Negative for dizziness, tingling, tremors, sensory change, speech change and focal weakness.  Endo/Heme/Allergies: Does not bruise/bleed easily.  Psychiatric/Behavioral: Negative for substance abuse. The patient is not nervous/anxious.   All other systems reviewed and are negative.   Objective: Telemetry: Variable AV and A pacing, 60's bpm Physical Exam: Blood pressure (!) 128/45, pulse 63, temperature 97.9 F (36.6 C), temperature source Oral, resp. rate 15, height 6' (1.829 m), weight 251 lb (113.9 kg), SpO2 98 %. Body mass index is 34.04 kg/m. General: Well developed, well nourished, in no acute distress. Head: Normocephalic, atraumatic, sclera non-icteric, no xanthomas, nares are without discharge. Neck: Negative for carotid bruits. JVP not elevated. Lungs: Clear bilaterally to auscultation without wheezes, rales, or rhonchi. Breathing is unlabored. Heart: RRR S1 S2  without murmurs, rubs, or gallops.  Abdomen: Obese, soft, non-tender, non-distended with normoactive bowel sounds. No rebound/guarding. Extremities: No clubbing or cyanosis. No edema. Distal pedal pulses are 2+ and equal bilaterally. Neuro: Alert and oriented X 3. Moves all extremities spontaneously. Psych:  Responds to questions appropriately with a normal affect.   Intake/Output Summary (Last 24 hours) at 07/28/16 0851 Last data filed at 07/28/16 0500  Gross per 24 hour  Intake             1545 ml  Output             1825 ml  Net             -280 ml    Inpatient Medications:  . allopurinol  100 mg Oral Daily  . citalopram  40 mg Oral Daily  . ferrous sulfate  325 mg Oral Q breakfast  . gabapentin  300 mg Oral QHS  . glipiZIDE  10 mg Oral QAC breakfast  . insulin aspart  0-5 Units Subcutaneous QHS  . insulin aspart  0-9 Units Subcutaneous TID WC  . levothyroxine  25 mcg Oral QAC breakfast  . pantoprazole  40 mg Oral Daily  . pneumococcal 23 valent vaccine  0.5 mL Intramuscular Tomorrow-1000   Infusions:  . sodium chloride 75 mL/hr at 07/28/16 0539    Labs:  Recent Labs  07/27/16 0528 07/28/16 0449  NA 141 140  K 4.8 4.8  CL 115* 114*  CO2 22 23  GLUCOSE 104* 117*  BUN 44* 34*  CREATININE 2.23* 1.75*  CALCIUM 8.3* 8.3*    Recent Labs  07/26/16 1523  AST 12*  ALT 9*  ALKPHOS 41  BILITOT 0.6  PROT 6.8  ALBUMIN 3.8    Recent Labs  07/26/16 1523 07/27/16 0528 07/28/16 0449  WBC  8.1 6.3 7.6  NEUTROABS 6.6*  --   --   HGB 10.1* 9.1* 9.3*  HCT 29.8* 27.2* 28.3*  MCV 95.6 95.1 96.5  PLT 134* 109* 104*    Recent Labs  07/26/16 1523 07/26/16 1858 07/26/16 2351  TROPONINI 0.09* 0.10* 0.13*   Invalid input(s): POCBNP No results for input(s): HGBA1C in the last 72 hours.   Weights: Filed Weights   07/26/16 1517  Weight: 251 lb (113.9 kg)     Radiology/Studies:  Dg Thoracic Spine 2 View  Result Date: 07/27/2016 CLINICAL DATA:  82 year old  male severe pain status post syncope and fall. Pain near the shoulder blades. Initial encounter. EXAM: THORACIC SPINE 2 VIEWS COMPARISON:  Portable chest 07/26/2016. FINDINGS: Calcified aortic atherosclerosis. Partially visible left chest cardiac pacemaker. Normal thoracic segmentation. Partially visible shoulder arthroplasty. Osteopenia. Cervicothoracic junction alignment is within normal limits. No thoracic compression fracture identified. No spondylolisthesis. Visible posterior ribs appear grossly intact. Stable visualized thoracic visceral contours. IMPRESSION: 1.  No acute fracture or listhesis identified in the thoracic spine. If occult thoracic compression fracture is suspected, Nuclear Medicine Whole-body Bone Scan would best evaluate further. 2.  Calcified aortic atherosclerosis. Electronically Signed   By: Odessa Fleming M.D.   On: 07/27/2016 16:17   US Carotid Bilateral  Result Date: 07/27/2016 CLINICAL DATA:  Syncope, hypertension, visual disturbance, hyperlipidemia and diabetes EXAM: BILATERAL CAROTID DUPLEX ULTRASOUND TECHNIQUE: Wallace Cullens scale imaging, color Doppler and duplex ultrasound were performed of bilateral carotid and vertebral arteries in the neck. COMPARISON:  None. FINDINGS: Criteria: Quantification of carotid stenosis is based on velocity parameters that correlate the residual internal carotid diameter with NASCET-based stenosis levels, using the diameter of the distal internal carotid lumen as the denominator for stenosis measurement. The following velocity measurements were obtained: RIGHT ICA:  148/22 cm/sec CCA:  119/19 cm/sec SYSTOLIC ICA/CCA RATIO:  1.3 DIASTOLIC ICA/CCA RATIO:  1.2 ECA:  140 cm/sec LEFT ICA:  119/22 cm/sec CCA:  88/16 cm/sec SYSTOLIC ICA/CCA RATIO:  1.3 DIASTOLIC ICA/CCA RATIO:  1.4 ECA:  210 cm/sec RIGHT CAROTID ARTERY: Mild-to-moderate heterogeneous and partially calcified right carotid system atherosclerosis extending into the proximal ICA. Slight velocity elevation  measuring 148/22 cm/sec. Minor luminal narrowing by grayscale imaging. Degree of stenosis still estimated at less than 50%. No significant turbulent flow. RIGHT VERTEBRAL ARTERY:  Antegrade LEFT CAROTID ARTERY: Similar moderate scattered and partially calcified carotid atherosclerosis. Slight luminal narrowing. No significant ICA stenosis, velocity elevation, or turbulent flow. Degree of narrowing also less than 50% LEFT VERTEBRAL ARTERY:  Antegrade IMPRESSION: Mild-to-moderate bilateral carotid atherosclerosis without hemodynamically significant ICA stenosis. Degree of narrowing less than 50% bilaterally. Patent antegrade vertebral flow. Electronically Signed   By: Judie Petit.  Shick M.D.   On: 07/27/2016 12:26   Dg Chest Portable 1 View  Result Date: 07/26/2016 CLINICAL DATA:  Chest pain radiating into shoulders.  Hypertension. EXAM: PORTABLE CHEST 1 VIEW COMPARISON:  None. FINDINGS: There is no edema or consolidation. There is slight scarring in the left base. Heart is borderline enlarged with pulmonary vascularity normal. Pacemaker leads are attached to the right atrium and right ventricle. No adenopathy. There is atherosclerotic calcification in the aorta. There is evidence of total shoulder replacements bilaterally. IMPRESSION: Slight left base scarring. No edema or consolidation. Heart borderline enlarged. Pacemaker leads attached to right atrium and right ventricle. There is aortic atherosclerosis. Electronically Signed   By: Bretta Bang III M.D.   On: 07/26/2016 16:16     Assessment and Plan  Principal Problem:  Syncope Active Problems:   Acute on chronic renal failure (HCC)   Dehydration   Elevated troponin   Back pain   Anemia   Thrombocytopenia (HCC)    1. Syncope: -Slow onset, likely neuromediated and not arrhythmia in etiology  -Call MDT again to interrogate device  -Pain improved -TEE unlikely to show possible dissection -CTA has been on hold given renal function  -Possibly  dehydrated as he is on HCTZ at home -Cannot rule out orthostatic hypotension given episode occurred with positional changes  -Would hold HCTZ -Echo with normal EF as above -Check magnesium   2. Symptomatic bradycardia: -Pacer to be interrogated as above -Appears to be functioning well on tele  3. PAF: -Await interrogation to assess Afib burden, call MDT again today to get them to come out -Cannot rule out post-termination pause leading to his syncope, though he has previously been asymptomatic with his Afib -Eliquis on hold given initial concern for aoritc dissection, though this may be less of a concern currently given resolution of pain -Continue home medications including Coreg as BP allows -CHADS2VASc at least 5 (HTN, age x 2, DM, vascular disease)  4. CAD/elevated troponin: -Echo as above with normal EF -Cannot rule out ischemic event -Plan for stress testing to evaluate for high-risk ischemia, may need to be done as outpatient given camera is down for maintenance  -Has been on dual therapy with ASA and Eliquis, defer to primary cardiology  -Likely supply demand ischemia in the setting of #1  5. Acute on CKD stage III: -Improving  -IV fluids -Per IM  6. Acute on anemia of chronic disease: -Likely dilutional  -Monitor    Signed, Eula ListenRyan Aiko Belko, PA-C [facility] Pager: [phone] 07/28/2016, 8:51 AM

## 2016-07-28 NOTE — Discharge Instructions (Signed)

## 2016-07-28 NOTE — Progress Notes (Signed)
Called [employer] again today to verify device interrogation. They were unable to determine why device was not interrogated on 07/27/16. Awaiting rep call back at this time to discuss device interrogation today.

## 2016-07-28 NOTE — Consult Note (Signed)
Date: 07/28/2016                  Patient Name:  Patrick Robinson  MRN: [id number]  DOB: 11-04-1932  Age / Sex: 82 y.o., male         PCP: No PCP Per Patient                 Service Requesting Consult: Internal medicine                 Reason for Consult: Acute renal failure, chronic kidney disease            History of Present Illness: Patient is a 82 y.o. male with medical problems of coronary artery disease, pacemaker, diabetes, hypertension, chronic kidney disease, office known atrial fibrillation, prostate cancer in remission, hypothyroidism, obstructive sleep apnea using CPAP, obesity who was admitted to [facility] on 07/26/2016 for evaluation of syncopal event during driving.  Patient states that he ate breakfast at [employer]- pork chops and eggs- on his way back, he developed pain in the back of his chest, between shoulder blades.  After a few minutes, he felt blurry vision. He states pain was reproducible. Improved with Advil in the past. Different from his heart pain in the past.  He was brought in to the hospital for evaluation Initially, a CT scan was contemplated to evaluate for aortic dissection.  It was deferred because of risk of causing acute renal failure.  Also, patient's pain has resolved.  He does not feel short of breath.  The pressure control is variable but acceptable at present. 2-D echo done this admission shows concentric left ventricular hypertrophy.  Normal systolic function with EF 55-60%. No regional wall motion abnormalities.  He does have grade 1 diastolic dysfunction. Nephrology consult has been requested for evaluation of acute renal failure Baseline creatinine appears to be 2.0 from November 2016 Admission creatinine is 2.76 With IV hydration, creatinine has improved to 1.75/GFR 35 He does not have any significant leg edema. No problems with voiding.  No c/o blood in the urine.   Medications: Outpatient medications: Prescriptions Prior to Admission   Medication Sig Dispense Refill Last Dose  . allopurinol (ZYLOPRIM) 100 MG tablet Take 100 mg by mouth daily.   07/25/2016 at pm  . citalopram (CELEXA) 40 MG tablet Take 40 mg by mouth daily.   07/26/2016 at 0900  . ferrous sulfate 325 (65 FE) MG tablet Take 325 mg by mouth daily with breakfast.   07/25/2016 at pm  . gabapentin (NEURONTIN) 300 MG capsule Take 300 mg by mouth at bedtime.   07/25/2016 at Unknown time  . glipiZIDE (GLUCOTROL) 10 MG tablet Take 10 mg by mouth daily before breakfast.   07/26/2016 at 0900  . hydrochlorothiazide (HYDRODIURIL) 25 MG tablet Take 25 mg by mouth daily.   07/25/2016 at 0900  . levothyroxine (SYNTHROID, LEVOTHROID) 25 MCG tablet Take 25 mcg by mouth daily before breakfast.   07/25/2016 at 0900  . lisinopril (PRINIVIL,ZESTRIL) 20 MG tablet Take 20 mg by mouth daily.   07/26/2016 at 0900  . losartan (COZAAR) 25 MG tablet Take 25 mg by mouth daily.   07/25/2016 at Unknown time  . omeprazole (PRILOSEC) 20 MG capsule Take 20 mg by mouth daily.   07/25/2016 at 0900  . oxybutynin (DITROPAN) 5 MG tablet Take 5 mg by mouth daily.   07/26/2016 at 0900    Current medications: Current Facility-Administered Medications  Medication Dose Route Frequency Provider Last  Rate Last Dose  . 0.9 %  sodium chloride infusion   Intravenous Continuous Alford Highland, MD 75 mL/hr at 07/28/16 0539    . acetaminophen (TYLENOL) tablet 650 mg  650 mg Oral Q6H PRN Alford Highland, MD   650 mg at 07/27/16 2306   Or  . acetaminophen (TYLENOL) suppository 650 mg  650 mg Rectal Q6H PRN Alford Highland, MD      . allopurinol (ZYLOPRIM) tablet 100 mg  100 mg Oral Daily Alford Highland, MD   100 mg at 07/28/16 1146  . citalopram (CELEXA) tablet 40 mg  40 mg Oral Daily Alford Highland, MD   40 mg at 07/28/16 1146  . ferrous sulfate tablet 325 mg  325 mg Oral Q breakfast Alford Highland, MD   325 mg at 07/28/16 1147  . gabapentin (NEURONTIN) capsule 300 mg  300 mg Oral QHS Alford Highland, MD    300 mg at 07/27/16 2306  . glipiZIDE (GLUCOTROL) tablet 10 mg  10 mg Oral QAC breakfast Alford Highland, MD   10 mg at 07/28/16 1146  . HYDROcodone-acetaminophen (NORCO/VICODIN) 5-325 MG per tablet 1 tablet  1 tablet Oral Q4H PRN Katharina Caper, MD      . insulin aspart (novoLOG) injection 0-5 Units  0-5 Units Subcutaneous QHS Alford Highland, MD      . insulin aspart (novoLOG) injection 0-9 Units  0-9 Units Subcutaneous TID WC Alford Highland, MD      . levothyroxine (SYNTHROID, LEVOTHROID) tablet 25 mcg  25 mcg Oral QAC breakfast Alford Highland, MD   25 mcg at 07/28/16 1147  . pantoprazole (PROTONIX) EC tablet 40 mg  40 mg Oral Daily Alford Highland, MD   40 mg at 07/28/16 1146  . pneumococcal 23 valent vaccine (PNU-IMMUNE) injection 0.5 mL  0.5 mL Intramuscular Tomorrow-1000 Alford Highland, MD          Allergies: No Known Allergies    Past Medical History: Past Medical History:  Diagnosis Date  . Chronic kidney disease   . Coronary artery disease   . Diabetes mellitus without complication (HCC)   . Hypertension   . Presence of permanent cardiac pacemaker   . Symptomatic bradycardia      Past Surgical History: Past Surgical History:  Procedure Laterality Date  . PACEMAKER INSERTION    . REPLACEMENT TOTAL KNEE BILATERAL    . SHOULDER SURGERY Bilateral      Family History: Family History  Problem Relation Age of Onset  . Alzheimer's disease Mother   . CVA Father      Social History: Social History   Social History  . Marital status: Married    Spouse name: N/A  . Number of children: N/A  . Years of education: N/A   Occupational History  . Not on file.   Social History Main Topics  . Smoking status: Never Smoker  . Smokeless tobacco: Never Used  . Alcohol use No  . Drug use: No  . Sexual activity: Not on file   Other Topics Concern  . Not on file   Social History Narrative  . No narrative on file     Review of Systems: Gen: no fevers, chills or  weight loss HEENT: experienced blurry vision during episode.  Back to usual at present CV: no chest pain.  No shortness of breath. Resp: no cough, sputum ZO:XWRUEAVW is good, no nausea or vomiting, blood in the stool GU : no problems reported but voiding MS: pain in the back close to shoulders  Derm:  no complaints Psych: no evidence Heme:  No complaints Neuro:  No complaints Endocrine. No c/o  Vital Signs: Blood pressure 137/71, pulse 73, temperature 99.2 F (37.3 C), temperature source Oral, resp. rate 18, height 6' (1.829 m), weight 113.9 kg (251 lb), SpO2 98 %.   Intake/Output Summary (Last 24 hours) at 07/28/16 1201 Last data filed at 07/28/16 1039  Gross per 24 hour  Intake             1425 ml  Output             2425 ml  Net            -1000 ml    Weight trends: [employer]   07/26/16 1517  Weight: 113.9 kg (251 lb)    Physical Exam: General:  no acute distress  HEENT Anicteric, moist mucous membranes  Neck:  supple  Lungs: Normal breathing effort, clear to auscultation  Heart::  no rub  Abdomen: obese, soft, nontender  Extremities: no edema   Neurologic: Alert, oriented  Skin: No acute rashes     Foley:        Lab results: Basic Metabolic Panel:  Recent Labs Lab 07/26/16 1523 07/27/16 0528 07/28/16 0449  NA 139 141 140  K 5.2* 4.8 4.8  CL 110 115* 114*  CO2 23 22 23   GLUCOSE 121* 104* 117*  BUN 44* 44* 34*  CREATININE 2.76* 2.23* 1.75*  CALCIUM 8.4* 8.3* 8.3*    Liver Function Tests:  Recent Labs Lab 07/26/16 1523  AST 12*  ALT 9*  ALKPHOS 41  BILITOT 0.6  PROT 6.8  ALBUMIN 3.8   No results for input(s): LIPASE, AMYLASE in the last 168 hours. No results for input(s): AMMONIA in the last 168 hours.  CBC:  Recent Labs Lab 07/26/16 1523 07/27/16 0528 07/28/16 0449  WBC 8.1 6.3 7.6  NEUTROABS 6.6*  --   --   HGB 10.1* 9.1* 9.3*  HCT 29.8* 27.2* 28.3*  MCV 95.6 95.1 96.5  PLT 134* 109* 104*    Cardiac  Enzymes:  Recent Labs Lab 07/26/16 2351  TROPONINI 0.13*    BNP: Invalid input(s): POCBNP  CBG:  Recent Labs Lab 07/27/16 0727 07/27/16 1204 07/27/16 1645 07/27/16 2123 07/28/16 0737  GLUCAP 105* 77 93 96 124*    Microbiology: No results found for this or any previous visit (from the past 720 hour(s)).   Coagulation Studies: No results for input(s): LABPROT, INR in the last 72 hours.  Urinalysis: No results for input(s): COLORURINE, LABSPEC, PHURINE, GLUCOSEU, HGBUR, BILIRUBINUR, KETONESUR, PROTEINUR, UROBILINOGEN, NITRITE, LEUKOCYTESUR in the last 72 hours.  Invalid input(s): APPERANCEUR      Imaging: Dg Thoracic Spine 2 View  Result Date: 07/27/2016 CLINICAL DATA:  82 year old male severe pain status post syncope and fall. Pain near the shoulder blades. Initial encounter. EXAM: THORACIC SPINE 2 VIEWS COMPARISON:  Portable chest 07/26/2016. FINDINGS: Calcified aortic atherosclerosis. Partially visible left chest cardiac pacemaker. Normal thoracic segmentation. Partially visible shoulder arthroplasty. Osteopenia. Cervicothoracic junction alignment is within normal limits. No thoracic compression fracture identified. No spondylolisthesis. Visible posterior ribs appear grossly intact. Stable visualized thoracic visceral contours. IMPRESSION: 1.  No acute fracture or listhesis identified in the thoracic spine. If occult thoracic compression fracture is suspected, Nuclear Medicine Whole-body Bone Scan would best evaluate further. 2.  Calcified aortic atherosclerosis. Electronically Signed   By: Odessa Fleming M.D.   On: 07/27/2016 16:17   US Carotid Bilateral  Result Date: 07/27/2016  CLINICAL DATA:  Syncope, hypertension, visual disturbance, hyperlipidemia and diabetes EXAM: BILATERAL CAROTID DUPLEX ULTRASOUND TECHNIQUE: Wallace Cullens scale imaging, color Doppler and duplex ultrasound were performed of bilateral carotid and vertebral arteries in the neck. COMPARISON:  None. FINDINGS:  Criteria: Quantification of carotid stenosis is based on velocity parameters that correlate the residual internal carotid diameter with NASCET-based stenosis levels, using the diameter of the distal internal carotid lumen as the denominator for stenosis measurement. The following velocity measurements were obtained: RIGHT ICA:  148/22 cm/sec CCA:  119/19 cm/sec SYSTOLIC ICA/CCA RATIO:  1.3 DIASTOLIC ICA/CCA RATIO:  1.2 ECA:  140 cm/sec LEFT ICA:  119/22 cm/sec CCA:  88/16 cm/sec SYSTOLIC ICA/CCA RATIO:  1.3 DIASTOLIC ICA/CCA RATIO:  1.4 ECA:  210 cm/sec RIGHT CAROTID ARTERY: Mild-to-moderate heterogeneous and partially calcified right carotid system atherosclerosis extending into the proximal ICA. Slight velocity elevation measuring 148/22 cm/sec. Minor luminal narrowing by grayscale imaging. Degree of stenosis still estimated at less than 50%. No significant turbulent flow. RIGHT VERTEBRAL ARTERY:  Antegrade LEFT CAROTID ARTERY: Similar moderate scattered and partially calcified carotid atherosclerosis. Slight luminal narrowing. No significant ICA stenosis, velocity elevation, or turbulent flow. Degree of narrowing also less than 50% LEFT VERTEBRAL ARTERY:  Antegrade IMPRESSION: Mild-to-moderate bilateral carotid atherosclerosis without hemodynamically significant ICA stenosis. Degree of narrowing less than 50% bilaterally. Patent antegrade vertebral flow. Electronically Signed   By: Judie Petit.  Shick M.D.   On: 07/27/2016 12:26   Dg Chest Portable 1 View  Result Date: 07/26/2016 CLINICAL DATA:  Chest pain radiating into shoulders.  Hypertension. EXAM: PORTABLE CHEST 1 VIEW COMPARISON:  None. FINDINGS: There is no edema or consolidation. There is slight scarring in the left base. Heart is borderline enlarged with pulmonary vascularity normal. Pacemaker leads are attached to the right atrium and right ventricle. No adenopathy. There is atherosclerotic calcification in the aorta. There is evidence of total shoulder  replacements bilaterally. IMPRESSION: Slight left base scarring. No edema or consolidation. Heart borderline enlarged. Pacemaker leads attached to right atrium and right ventricle. There is aortic atherosclerosis. Electronically Signed   By: Bretta Bang III M.D.   On: 07/26/2016 16:16   Dg Knee 2 Views Right  Result Date: 07/28/2016 CLINICAL DATA:  Pain EXAM: RIGHT KNEE - 2 VIEW COMPARISON:  None. FINDINGS: Frontal and lateral views were obtained. The patient has undergone prior total knee replacement with femoral and tibial prosthetic components appearing well-seated. There is a moderate joint effusion. There is spurring along the posterior patella superiorly and inferiorly, more notably superiorly. There is extensive arterial vascular calcification. IMPRESSION: Joint effusion. 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 Femoral and tibial prosthetic components appear well seated. No acute fracture or dislocation. No evident erosive change. There is multifocal arterial vascular atherosclerosis. Electronically Signed   By: Bretta Bang III M.D.   On: 07/28/2016 09:59      Assessment & Plan: Pt is a 82 y.o. Caucasian male with coronary artery disease, significant atherosclerosis,  pacemaker, diabetes, hypertension, chronic kidney disease, office known atrial fibrillation, prostate cancer in remission, hypothyroidism, obstructive sleep apnea using CPAP, obesity who was admitted to [facility] on 07/26/2016 for evaluation of syncopal event during driving.  1.  Acute renal failure - serum creatinine has improved with IV hydration suggesting component of prerenal and renal failure - it appears that patient is taking lisinopril as well as losartan home.  Will recommend continuing only one of those - may use hydrochlorothiazide on as-needed basis only  2. Chronic kidney disease stage III  Underlying chronic kidney disease seems to be related to diabetes/hypertension and  atherosclerosis Today's creatinine 1.75/GFR 35. Possibly baseline  3.  Syncopal events Evaluation is in progress

## 2016-07-28 NOTE — Discharge Summary (Signed)
[facility] - [place] at [facility]   PATIENT NAME: Patrick Robinson    MR#:  [id number]  DATE OF BIRTH:  04/01/1933  DATE OF ADMISSION:  07/26/2016 ADMITTING PHYSICIAN: Alford Highland, MD  DATE OF DISCHARGE: No discharge date for patient encounter.  PRIMARY CARE PHYSICIAN: No PCP Per Patient     ADMISSION DIAGNOSIS:  NSTEMI (non-ST elevated myocardial infarction) (HCC) [I21.4] Syncope, unspecified syncope type [R55]  DISCHARGE DIAGNOSIS:  Principal Problem:   Syncope Active Problems:   Acute on chronic renal failure (HCC)   Dehydration   Elevated troponin   Bradycardia   Back pain   Anemia   Thrombocytopenia (HCC)   Paroxysmal atrial fibrillation (HCC)   SECONDARY DIAGNOSIS:   Past Medical History:  Diagnosis Date  . Chronic kidney disease   . Coronary artery disease   . Diabetes mellitus without complication (HCC)   . Hypertension   . Presence of permanent cardiac pacemaker   . Symptomatic bradycardia     .pro HOSPITAL COURSE:  The patient is 82 year old Caucasian male with past medical history significant for history of CK D with baseline 2.0, coronary artery disease, diabetes mellitus, hypertension, permanent pacemaker, who presents to the hospital with severe pain in between of her shoulder blades. He was driving when he started having pain, he got out from the car, and passed out. On arrival to emergency room, he was noted to have normal blood pressure of 117/59, his vital signs otherwise were unremarkable. His labs revealed acute on chronic renal failure with creatinine level of 2.76. Patient was admitted to the hospital for further evaluation and treatment, he was initiated on IV fluids and his kidney function somewhat improved. He is pain resolved. He was noted to have mild elevation of troponin with max level of 0.13. Cardiology consultation was requested. Echocardiogram was performed, unremarkable. Permanent pacemaker is to be  interrogated today per cardiologist's request. Cardiologist recommended to have CT angiogram of the chest to rule out aortic dissection, although patient's kidney function is not good enough to consider CT angiogram at this time. Blood pressure readings are different in both arms, left arm systolic blood pressure is 145, right arm 124.  Discussion by problem: #1. Syncope, etiology remains unclear, however, dehydration, was suspected, carotid ultrasound revealed degree of narrowing less than 50% bilaterally, antegrade vertebral flow, echocardiogram was normal. Discussed with cardiologist, Dr. Lewie Loron, who recommended follow-up as outpatient and possibly get CT angiogram of the chest to rule out aortic dissection as outpatient, this was discussed with nephrologist , who felt that TEE might be helpful to rule out ascending aortic dissection, however. Cardiologist does not feel that TEE is helpful, Dr. Lewie Loron is to look at echocardiogram and follow patient along. Possible discharge home if no CT angiogram is planned today.  #2. Acute on chronic renal failure, improved with IV fluid administration, continue IV fluids, follow creatinine outpatient, now off ACE inhibitor, only on ARB, off HCTZ, follow-up with nephrologist to recheck kidney function as outpatient #3. Elevated troponin, likely demand ischemia, echocardiogram is normal, follow-up with cardiologist as outpatient #4. Acute back pain, resolved now, cannot rule out aortic dissection, CT angiogram is a risky due to compromised kidney function, recommend CT angiogram as outpatient if kidney function continues to improve. Thoracic x-ray showed no vertebral compression fracture, bony injuries.  #5. Bradycardia, Pacer to be interrogated today, possible discharge home after interrogation #6. Paroxysmal atrial fibrillation, pacer to be interrogated later today, holding off Eliquis due to concerns of  aortic dissection , discussed with cardiologist DISCHARGE  CONDITIONS:   Stable  CONSULTS OBTAINED:  Treatment Team:  Iran Ouch, MD Mosetta Pigeon, MD  DRUG ALLERGIES:  No Known Allergies  DISCHARGE MEDICATIONS:   Current Discharge Medication List    START taking these medications   Details  HYDROcodone-acetaminophen (NORCO/VICODIN) 5-325 MG tablet Take 1 tablet by mouth every 4 (four) hours as needed for moderate pain. Qty: 30 tablet, Refills: 0      CONTINUE these medications which have NOT CHANGED   Details  allopurinol (ZYLOPRIM) 100 MG tablet Take 100 mg by mouth daily.    citalopram (CELEXA) 40 MG tablet Take 40 mg by mouth daily.    ferrous sulfate 325 (65 FE) MG tablet Take 325 mg by mouth daily with breakfast.    gabapentin (NEURONTIN) 300 MG capsule Take 300 mg by mouth at bedtime.    glipiZIDE (GLUCOTROL) 10 MG tablet Take 10 mg by mouth daily before breakfast.    levothyroxine (SYNTHROID, LEVOTHROID) 25 MCG tablet Take 25 mcg by mouth daily before breakfast.    losartan (COZAAR) 25 MG tablet Take 25 mg by mouth daily.    omeprazole (PRILOSEC) 20 MG capsule Take 20 mg by mouth daily.    oxybutynin (DITROPAN) 5 MG tablet Take 5 mg by mouth daily.      STOP taking these medications     hydrochlorothiazide (HYDRODIURIL) 25 MG tablet      lisinopril (PRINIVIL,ZESTRIL) 20 MG tablet          DISCHARGE INSTRUCTIONS:    Patient is to follow-up with primary care physician, nephrologist, cardiologist as outpatient  If you experience worsening of your admission symptoms, develop shortness of breath, life threatening emergency, suicidal or homicidal thoughts you must seek medical attention immediately by calling 911 or calling your MD immediately  if symptoms less severe.  You Must read complete instructions/literature along with all the possible adverse reactions/side effects for all the Medicines you take and that have been prescribed to you. Take any new Medicines after you have completely understood and  accept all the possible adverse reactions/side effects.   Please note  You were cared for by a hospitalist during your hospital stay. If you have any questions about your discharge medications or the care you received while you were in the hospital after you are discharged, you can call the unit and asked to speak with the hospitalist on call if the hospitalist that took care of you is not available. Once you are discharged, your primary care physician will handle any further medical issues. Please note that NO REFILLS for any discharge medications will be authorized once you are discharged, as it is imperative that you return to your primary care physician (or establish a relationship with a primary care physician if you do not have one) for your aftercare needs so that they can reassess your need for medications and monitor your lab values.    Today   CHIEF COMPLAINT:   Chief Complaint  Patient presents with  . Dizziness    HISTORY OF PRESENT ILLNESS:  Patrick Robinson  is a 82 y.o. male with a known history of CK D with baseline 2.0, coronary artery disease, diabetes mellitus, hypertension, permanent pacemaker, who presents to the hospital with severe pain in between of her shoulder blades. He was driving when he started having pain, he got out from the car, and passed out. On arrival to emergency room, he was noted to have normal blood  pressure of 117/59, his vital signs otherwise were unremarkable. His labs revealed acute on chronic renal failure with creatinine level of 2.76. Patient was admitted to the hospital for further evaluation and treatment, he was initiated on IV fluids and his kidney function somewhat improved. He is pain resolved. He was noted to have mild elevation of troponin with max level of 0.13. Cardiology consultation was requested. Echocardiogram was performed, unremarkable. Permanent pacemaker is to be interrogated today per cardiologist's request. Cardiologist recommended to  have CT angiogram of the chest to rule out aortic dissection, although patient's kidney function is not good enough to consider CT angiogram at this time. Blood pressure readings are different in both arms, left arm systolic blood pressure is 145, right arm 124.  Discussion by problem: #1. Syncope, etiology remains unclear, however, dehydration, was suspected, carotid ultrasound revealed degree of narrowing less than 50% bilaterally, antegrade vertebral flow, echocardiogram was normal. Discussed with cardiologist, Dr. Lewie Loron, who recommended follow-up as outpatient and possibly get CT angiogram of the chest to rule out aortic dissection as outpatient, this was discussed with nephrologist , who felt that TEE might be helpful to rule out ascending aortic dissection, however. Cardiologist does not feel that TEE is helpful, Dr. Lewie Loron is to look at echocardiogram and follow patient along. Possible discharge home if no CT angiogram is planned today. Hemoglobin remains stable #2. Acute on chronic renal failure, improved with IV fluid administration, continue IV fluids, follow creatinine outpatient, now off ACE inhibitor, only on ARB, off HCTZ, follow-up with nephrologist to recheck kidney function as outpatient #3. Elevated troponin, likely demand ischemia, echocardiogram is normal, follow-up with cardiologist as outpatient #4. Acute back pain, resolved now, cannot rule out aortic dissection, CT angiogram is a risky due to compromised kidney function, recommend CT angiogram as outpatient if kidney function continues to improve. Thoracic x-ray showed no vertebral compression fracture, bony injuries.  #5. Bradycardia, Pacer to be interrogated today, possible discharge home after interrogation #6. Paroxysmal atrial fibrillation, pacer to be interrogated later today, holding off Eliquis due to concerns of aortic dissection , discussed with cardiologist    VITAL SIGNS:  Blood pressure 137/71, pulse 73, temperature  99.2 F (37.3 C), temperature source Oral, resp. rate 18, height 6' (1.829 m), weight 113.9 kg (251 lb), SpO2 98 %.  I/O:   Intake/Output Summary (Last 24 hours) at 07/28/16 1359 Last data filed at 07/28/16 1039  Gross per 24 hour  Intake             1185 ml  Output             2050 ml  Net             -865 ml    PHYSICAL EXAMINATION:  GENERAL:  82 y.o.-year-old patient lying in the bed with no acute distress.  EYES: Pupils equal, round, reactive to light and accommodation. No scleral icterus. Extraocular muscles intact.  HEENT: Head atraumatic, normocephalic. Oropharynx and nasopharynx clear.  NECK:  Supple, no jugular venous distention. No thyroid enlargement, no tenderness.  LUNGS: Normal breath sounds bilaterally, no wheezing, rales,rhonchi or crepitation. No use of accessory muscles of respiration.  CARDIOVASCULAR: S1, S2 normal. No murmurs, rubs, or gallops.  ABDOMEN: Soft, non-tender, non-distended. Bowel sounds present. No organomegaly or mass.  EXTREMITIES: No pedal edema, cyanosis, or clubbing.  NEUROLOGIC: Cranial nerves II through XII are intact. Muscle strength 5/5 in all extremities. Sensation intact. Gait not checked.  PSYCHIATRIC: The patient is alert and oriented  x 3.  SKIN: No obvious rash, lesion, or ulcer.   DATA REVIEW:   CBC  Recent Labs Lab 07/28/16 0449  WBC 7.6  HGB 9.3*  HCT 28.3*  PLT 104*    Chemistries   Recent Labs Lab 07/26/16 1523  07/28/16 0449  NA 139  < > 140  K 5.2*  < > 4.8  CL 110  < > 114*  CO2 23  < > 23  GLUCOSE 121*  < > 117*  BUN 44*  < > 34*  CREATININE 2.76*  < > 1.75*  CALCIUM 8.4*  < > 8.3*  MG  --   --  2.0  AST 12*  --   --   ALT 9*  --   --   ALKPHOS 41  --   --   BILITOT 0.6  --   --   < > = values in this interval not displayed.  Cardiac Enzymes  Recent Labs Lab 07/26/16 2351  TROPONINI 0.13*    Microbiology Results  No results found for this or any previous visit.  RADIOLOGY:  Dg Thoracic  Spine 2 View  Result Date: 07/27/2016 CLINICAL DATA:  82 year old male severe pain status post syncope and fall. Pain near the shoulder blades. Initial encounter. EXAM: THORACIC SPINE 2 VIEWS COMPARISON:  Portable chest 07/26/2016. FINDINGS: Calcified aortic atherosclerosis. Partially visible left chest cardiac pacemaker. Normal thoracic segmentation. Partially visible shoulder arthroplasty. Osteopenia. Cervicothoracic junction alignment is within normal limits. No thoracic compression fracture identified. No spondylolisthesis. Visible posterior ribs appear grossly intact. Stable visualized thoracic visceral contours. IMPRESSION: 1.  No acute fracture or listhesis identified in the thoracic spine. If occult thoracic compression fracture is suspected, Nuclear Medicine Whole-body Bone Scan would best evaluate further. 2.  Calcified aortic atherosclerosis. Electronically Signed   By: Odessa FlemingH  Hall M.D.   On: 07/27/2016 16:17   [place] Carotid Bilateral  Result Date: 07/27/2016 CLINICAL DATA:  Syncope, hypertension, visual disturbance, hyperlipidemia and diabetes EXAM: BILATERAL CAROTID DUPLEX ULTRASOUND TECHNIQUE: Wallace CullensGray scale imaging, color Doppler and duplex ultrasound were performed of bilateral carotid and vertebral arteries in the neck. COMPARISON:  None. FINDINGS: Criteria: Quantification of carotid stenosis is based on velocity parameters that correlate the residual internal carotid diameter with NASCET-based stenosis levels, using the diameter of the distal internal carotid lumen as the denominator for stenosis measurement. The following velocity measurements were obtained: RIGHT ICA:  148/22 cm/sec CCA:  119/19 cm/sec SYSTOLIC ICA/CCA RATIO:  1.3 DIASTOLIC ICA/CCA RATIO:  1.2 ECA:  140 cm/sec LEFT ICA:  119/22 cm/sec CCA:  88/16 cm/sec SYSTOLIC ICA/CCA RATIO:  1.3 DIASTOLIC ICA/CCA RATIO:  1.4 ECA:  210 cm/sec RIGHT CAROTID ARTERY: Mild-to-moderate heterogeneous and partially calcified right carotid system  atherosclerosis extending into the proximal ICA. Slight velocity elevation measuring 148/22 cm/sec. Minor luminal narrowing by grayscale imaging. Degree of stenosis still estimated at less than 50%. No significant turbulent flow. RIGHT VERTEBRAL ARTERY:  Antegrade LEFT CAROTID ARTERY: Similar moderate scattered and partially calcified carotid atherosclerosis. Slight luminal narrowing. No significant ICA stenosis, velocity elevation, or turbulent flow. Degree of narrowing also less than 50% LEFT VERTEBRAL ARTERY:  Antegrade IMPRESSION: Mild-to-moderate bilateral carotid atherosclerosis without hemodynamically significant ICA stenosis. Degree of narrowing less than 50% bilaterally. Patent antegrade vertebral flow. Electronically Signed   By: Judie PetitM.  Shick M.D.   On: 07/27/2016 12:26   Dg Chest Portable 1 View  Result Date: 07/26/2016 CLINICAL DATA:  Chest pain radiating into shoulders.  Hypertension. EXAM: PORTABLE CHEST 1 VIEW  COMPARISON:  None. FINDINGS: There is no edema or consolidation. There is slight scarring in the left base. Heart is borderline enlarged with pulmonary vascularity normal. Pacemaker leads are attached to the right atrium and right ventricle. No adenopathy. There is atherosclerotic calcification in the aorta. There is evidence of total shoulder replacements bilaterally. IMPRESSION: Slight left base scarring. No edema or consolidation. Heart borderline enlarged. Pacemaker leads attached to right atrium and right ventricle. There is aortic atherosclerosis. Electronically Signed   By: Bretta Bang III M.D.   On: 07/26/2016 16:16   Dg Knee 2 Views Right  Result Date: 07/28/2016 CLINICAL DATA:  Pain EXAM: RIGHT KNEE - 2 VIEW COMPARISON:  None. FINDINGS: Frontal and lateral views were obtained. The patient has undergone prior total knee replacement with femoral and tibial prosthetic components appearing well-seated. There is a moderate joint effusion. There is spurring along the posterior  patella superiorly and inferiorly, more notably superiorly. There is extensive arterial vascular calcification. IMPRESSION: Joint effusion. 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 Femoral and tibial prosthetic components appear well seated. No acute fracture or dislocation. No evident erosive change. There is multifocal arterial vascular atherosclerosis. Electronically Signed   By: Bretta Bang III M.D.   On: 07/28/2016 09:59    EKG:   Orders placed or performed during the hospital encounter of 07/26/16  . EKG 12-Lead  . EKG 12-Lead      Management plans discussed with the patient, family and they are in agreement.  CODE STATUS:     Code Status Orders        Start     Ordered   07/26/16 1733  Full code  Continuous     07/26/16 1732    Code Status History    Date Active Date Inactive Code Status Order ID Comments User Context   This patient has a current code status but no historical code status.      TOTAL TIME TAKING CARE OF THIS PATIENT: 40 minutes.    Katharina Caper M.D on 07/28/2016 at 1:59 PM  Between 7am to 6pm - Pager - [phone]  After 6pm go to [email or web address] - password [email or web address]  [facility] [place]  Office  [phone]  CC: Primary care physician; No PCP Per Patient

## 2016-07-28 NOTE — Care Management Note (Addendum)
Case Management Note  Patient Details  Name: Patrick Robinson MRN: [id number] Date of Birth: 02-20-1933  Subjective/Objective:  RNCM assessment for discharge planning. Met with patient. He lives at home with his wife. Admitted with syncope. PT recommending home PT. Patient is agreeable. No agency preference. Referral to [facility] for PT. PCP is Dr. Jefm Bryant. He has a cane, crutches, wheelchair and Rolator. No DME needs. Uses [facility] in [place].               Action/PlanAlvis Lemmings for PT.   Expected Discharge Date:   07/28/2016               Expected Discharge Plan:  [place]  In-House Referral:     Discharge planning Services  CM Consult  Post Acute Care Choice:  Home Health Choice offered to:  Patient  DME Arranged:    DME Agency:     HH Arranged:  PT HH Agency:  Alvis Lemmings  Status of Service:  Completed, signed off  If discussed at [place] of Stay Meetings, dates discussed:    Additional Comments:  Jolly Mango, RN 07/28/2016, 11:56 AM

## 2016-07-28 NOTE — Care Management Important Message (Deleted)
Important Message  Patient Details  Name: Patrick Robinson MRN: [id number] Date of Birth: [birth year]   Medicare Important Message Given:  Yes    Marily MemosLisa M Shatoya Roets, RN 07/28/2016, 2:29 PM

## 2016-08-03 ENCOUNTER — Institutional Professional Consult (permissible substitution): Payer: Medicare HMO | Admitting: Cardiology

## 2018-04-14 IMAGING — DX DG KNEE 3 VIEWS*R*
2 series · 2 of 2 positions shown · non-contrast
Comparison: None.

CLINICAL DATA: Pain

EXAM:
RIGHT KNEE - 2 VIEW

[knee ap]
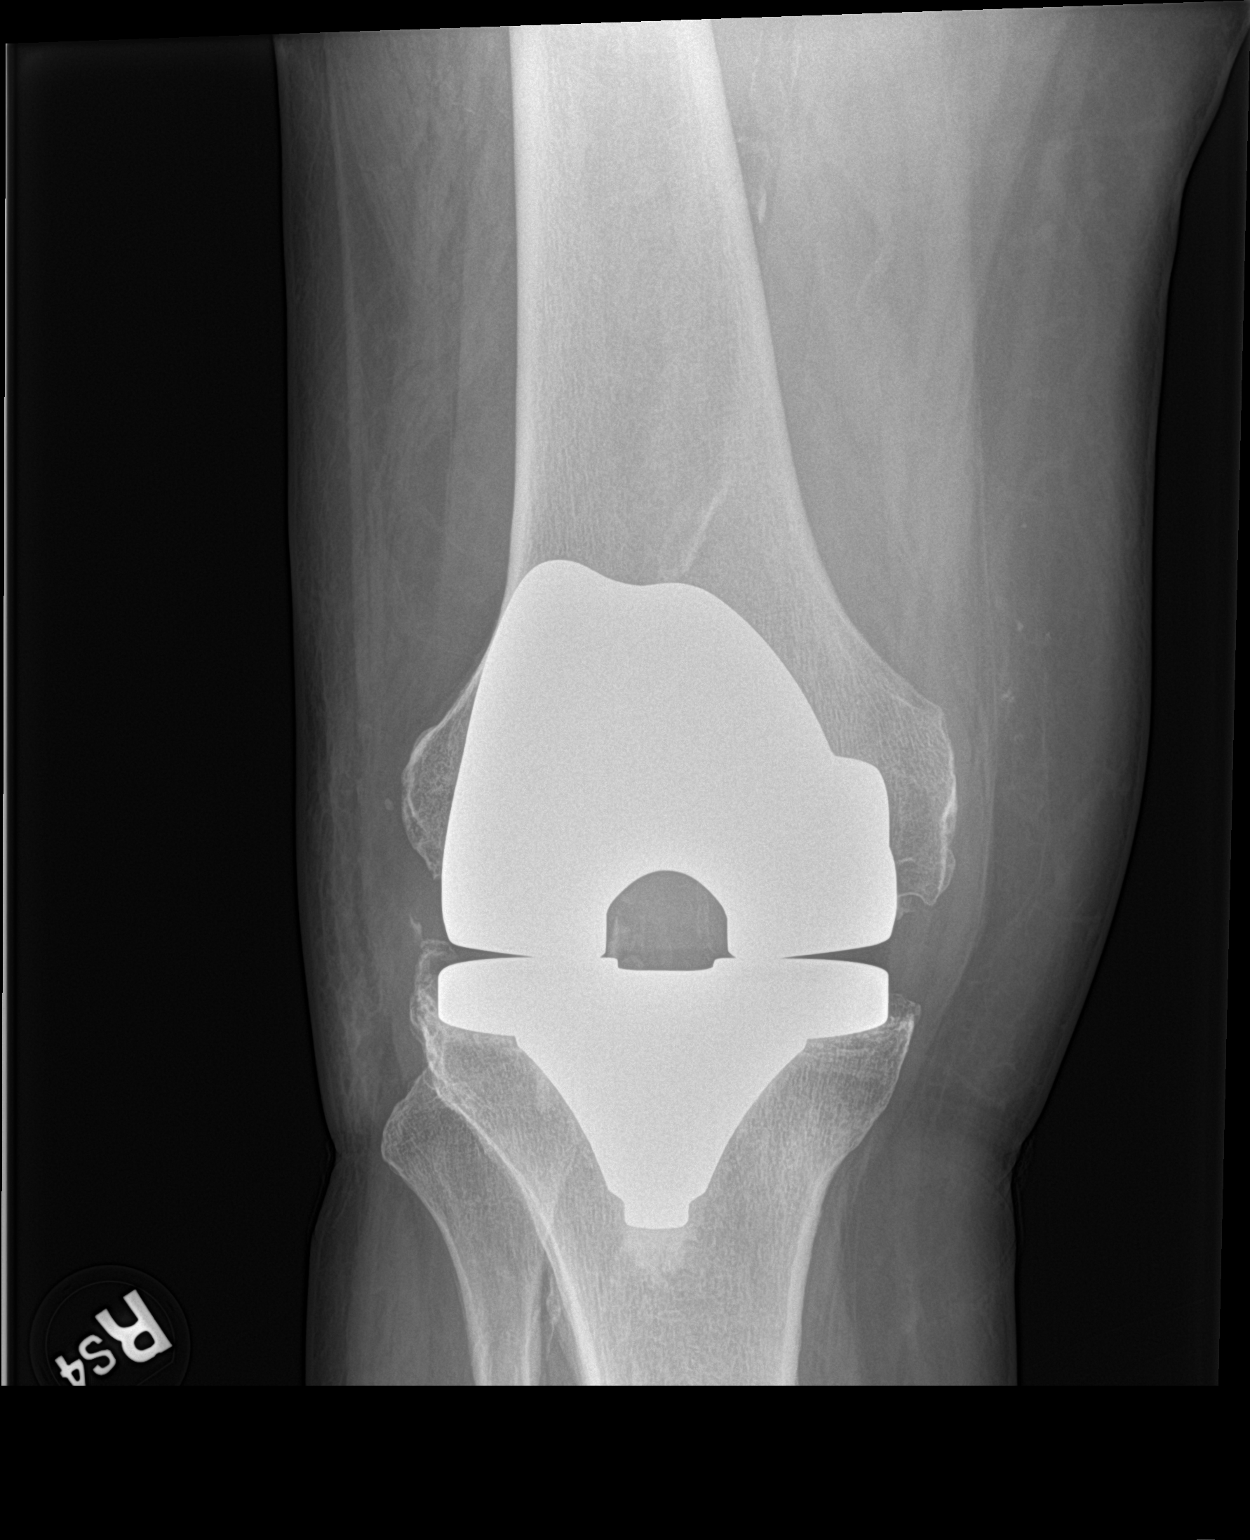

[knee lat]
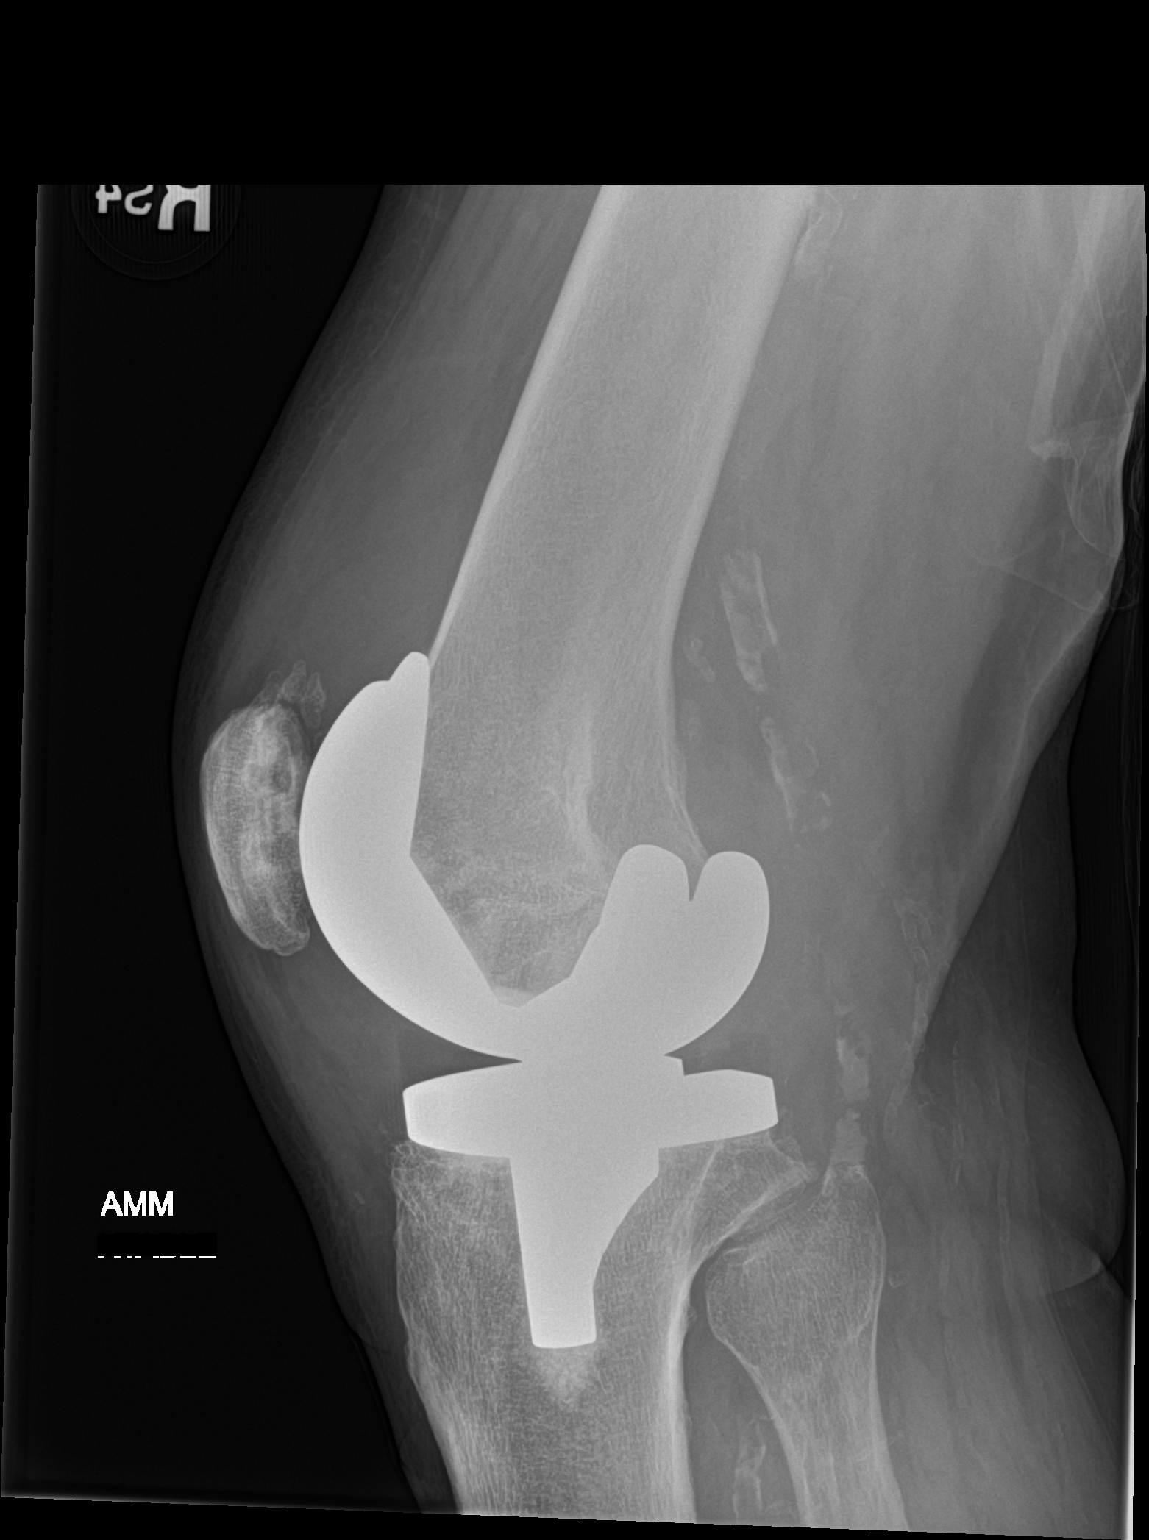

[2 of 2 positions shown; findings below may reference images not displayed]

FINDINGS: Frontal and lateral views were obtained. The patient has undergone
prior total knee replacement with femoral and tibial prosthetic
components appearing well-seated. There is a moderate joint
effusion. There is spurring along the posterior patella superiorly
and inferiorly, more notably superiorly. There is extensive arterial
vascular calcification.
IMPRESSION: Joint effusion. Osteoarthritic change in the patellofemoral joint
region. Femoral and tibial prosthetic components appear well seated.
No acute fracture or dislocation. No evident erosive change. There
is multifocal arterial vascular atherosclerosis.

## 2018-06-27 ENCOUNTER — Telehealth: Payer: Self-pay | Admitting: *Deleted

## 2018-06-27 NOTE — Telephone Encounter (Signed)
Opened in error

## 2019-02-07 DEATH — deceased
